# Patient Record
Sex: Female | Born: 1970 | Race: White | Hispanic: No | Marital: Single | Smoking: Former smoker
Health system: Southern US, Community
[De-identification: ages and names within clinical notes are randomized; demographics above are authoritative.]

## PROBLEM LIST (undated history)

## (undated) DIAGNOSIS — F319 Bipolar disorder, unspecified: Secondary | ICD-10-CM

## (undated) DIAGNOSIS — F32A Depression, unspecified: Secondary | ICD-10-CM

## (undated) DIAGNOSIS — J449 Chronic obstructive pulmonary disease, unspecified: Secondary | ICD-10-CM

## (undated) DIAGNOSIS — F609 Personality disorder, unspecified: Secondary | ICD-10-CM

## (undated) DIAGNOSIS — Z9889 Other specified postprocedural states: Secondary | ICD-10-CM

## (undated) DIAGNOSIS — M810 Age-related osteoporosis without current pathological fracture: Secondary | ICD-10-CM

## (undated) DIAGNOSIS — T7840XA Allergy, unspecified, initial encounter: Secondary | ICD-10-CM

## (undated) DIAGNOSIS — F909 Attention-deficit hyperactivity disorder, unspecified type: Secondary | ICD-10-CM

## (undated) DIAGNOSIS — N159 Renal tubulo-interstitial disease, unspecified: Secondary | ICD-10-CM

## (undated) DIAGNOSIS — R011 Cardiac murmur, unspecified: Secondary | ICD-10-CM

## (undated) DIAGNOSIS — S83249A Other tear of medial meniscus, current injury, unspecified knee, initial encounter: Secondary | ICD-10-CM

## (undated) DIAGNOSIS — F329 Major depressive disorder, single episode, unspecified: Secondary | ICD-10-CM

## (undated) DIAGNOSIS — R252 Cramp and spasm: Secondary | ICD-10-CM

## (undated) DIAGNOSIS — S83519A Sprain of anterior cruciate ligament of unspecified knee, initial encounter: Secondary | ICD-10-CM

## (undated) DIAGNOSIS — F4310 Post-traumatic stress disorder, unspecified: Secondary | ICD-10-CM

## (undated) DIAGNOSIS — J45909 Unspecified asthma, uncomplicated: Secondary | ICD-10-CM

## (undated) DIAGNOSIS — M1990 Unspecified osteoarthritis, unspecified site: Secondary | ICD-10-CM

## (undated) DIAGNOSIS — R112 Nausea with vomiting, unspecified: Secondary | ICD-10-CM

## (undated) DIAGNOSIS — F209 Schizophrenia, unspecified: Secondary | ICD-10-CM

## (undated) DIAGNOSIS — F419 Anxiety disorder, unspecified: Secondary | ICD-10-CM

## (undated) HISTORY — DX: Allergy, unspecified, initial encounter: T78.40XA

## (undated) HISTORY — DX: Sprain of anterior cruciate ligament of unspecified knee, initial encounter: S83.519A

## (undated) HISTORY — DX: Cardiac murmur, unspecified: R01.1

## (undated) HISTORY — DX: Age-related osteoporosis without current pathological fracture: M81.0

## (undated) HISTORY — DX: Unspecified osteoarthritis, unspecified site: M19.90

## (undated) HISTORY — DX: Chronic obstructive pulmonary disease, unspecified: J44.9

## (undated) HISTORY — DX: Attention-deficit hyperactivity disorder, unspecified type: F90.9

## (undated) HISTORY — DX: Schizophrenia, unspecified: F20.9

## (undated) HISTORY — PX: TUBAL LIGATION: SHX77

## (undated) HISTORY — DX: Personality disorder, unspecified: F60.9

## (undated) HISTORY — DX: Other tear of medial meniscus, current injury, unspecified knee, initial encounter: S83.249A

---

## 2007-04-24 ENCOUNTER — Ambulatory Visit: Payer: Self-pay

## 2008-02-11 ENCOUNTER — Emergency Department: Payer: Self-pay

## 2010-12-06 ENCOUNTER — Emergency Department: Payer: Self-pay | Admitting: Emergency Medicine

## 2011-06-09 ENCOUNTER — Emergency Department: Payer: Self-pay | Admitting: Emergency Medicine

## 2012-02-29 ENCOUNTER — Emergency Department (HOSPITAL_COMMUNITY)
Admission: EM | Admit: 2012-02-29 | Discharge: 2012-02-29 | Disposition: A | Discharge status: Home / Self Care | Payer: Self-pay | Attending: Emergency Medicine | Admitting: Emergency Medicine

## 2012-02-29 ENCOUNTER — Encounter (HOSPITAL_COMMUNITY): Payer: Self-pay | Admitting: *Deleted

## 2012-02-29 ENCOUNTER — Emergency Department (HOSPITAL_COMMUNITY): Payer: Self-pay

## 2012-02-29 DIAGNOSIS — M79642 Pain in left hand: Secondary | ICD-10-CM

## 2012-02-29 DIAGNOSIS — M7989 Other specified soft tissue disorders: Secondary | ICD-10-CM | POA: Insufficient documentation

## 2012-02-29 DIAGNOSIS — M79609 Pain in unspecified limb: Secondary | ICD-10-CM | POA: Insufficient documentation

## 2012-02-29 HISTORY — DX: Renal tubulo-interstitial disease, unspecified: N15.9

## 2012-02-29 MED ORDER — IBUPROFEN 800 MG PO TABS
800.0000 mg | ORAL_TABLET | Freq: Once | ORAL | Status: AC
  Administered 2012-02-29: 800 mg via ORAL
  Filled 2012-02-29: qty 1

## 2012-02-29 MED ORDER — IBUPROFEN 800 MG PO TABS: 800.0000 mg | ORAL_TABLET | Freq: Three times a day (TID) | ORAL | Status: AC

## 2012-02-29 NOTE — ED Notes (Signed)
Pt. Friend noted yelling out for the nurse.  When entering the room pt. And friend began to laugh and smirk. Pt. said that she was "fine and did not need anything."

## 2012-02-29 NOTE — ED Notes (Addendum)
C/o L hand swelling and pain, onset yesterday afternoon "while torquing a wrench", may have gripped it too tight. Swelling noted. CMS intact. Cap refill <2sec.

## 2012-02-29 NOTE — ED Provider Notes (Signed)
History     CSN: [id number]  Arrival date & time 02/29/12  0230   First MD Initiated Contact with Patient 02/29/12 [phone]      Chief Complaint  Patient presents with  . Hand Pain    (Consider location/radiation/quality/duration/timing/severity/associated sxs/prior treatment) HPI HX per PT. 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 No injury otherwise, no rash, no arm pain otherwise, no CP o SOB, no h/o same. Mild to mod in severity. Pain is dull and not radiating Past Medical History  Diagnosis Date  . Kidney infection     Past Surgical History  Procedure Date  . Cesarean section     No family history on file.  History  Substance Use Topics  . Smoking status: Never Smoker   . Smokeless tobacco: Not on file  . Alcohol Use: No    OB History    Grav Para Term Preterm Abortions TAB SAB Ect Mult Living                  Review of Systems  Constitutional: Negative for fever and chills.  HENT: Negative for neck pain and neck stiffness.   Eyes: Negative for pain.  Respiratory: Negative for shortness of breath.   Cardiovascular: Negative for chest pain.  Gastrointestinal: Negative for abdominal pain.  Genitourinary: Negative for dysuria.  Musculoskeletal: Negative for back pain.  Skin: Negative for rash.  Neurological: Negative for headaches.  All other systems reviewed and are negative.    Allergies  Keflex  Home Medications  No current outpatient prescriptions on file.  BP 120/80  Pulse 84  Temp 98.1 F (36.7 C) (Oral)  Resp 18  SpO2 99%  LMP 02/13/2012  Physical Exam  Constitutional: She is oriented to person, place, and time. She appears well-developed and well-nourished.  HENT:  Head: Normocephalic and atraumatic.  Eyes: Conjunctivae and EOM are normal. Pupils are equal, round, and reactive to light.  Neck: Trachea normal. Neck supple. No thyromegaly present.  Cardiovascular: Normal  rate, regular rhythm, S1 normal, S2 normal and normal pulses.     No systolic murmur is present   No diastolic murmur is present  Pulses:      Radial pulses are 2+ on the right side, and 2+ on the left side.  Pulmonary/Chest: Effort normal and breath sounds normal. She has no wheezes. She has no rhonchi. She has no rales. She exhibits no tenderness.  Abdominal: Soft. Normal appearance and bowel sounds are normal. There is no tenderness. There is no CVA tenderness and negative Murphy's sign.  Musculoskeletal:       LUE:s TTP with mild edema over dorsum of hand, no cords or erythema, no ecchymosis. No inc warmth to touch. Distal N/V intact. nontender over wrist and FA  Neurological: She is alert and oriented to person, place, and time. She has normal strength. No cranial nerve deficit or sensory deficit. GCS eye subscore is 4. GCS verbal subscore is 5. GCS motor subscore is 6.  Skin: Skin is warm and dry. No rash noted. She is not diaphoretic.  Psychiatric: Her speech is normal.       Cooperative and appropriate    ED Course  Procedures (including critical care time)  Labs Reviewed - No data to display Dg Hand Complete Left  02/29/2012  *RADIOLOGY REPORT*  Clinical Data: Hurt hand while tightening a wrench, pain across the metacarpals  LEFT HAND - COMPLETE 3+  VIEW  Comparison: None  Findings: Dorsal soft tissue swelling left hand. Bone mineralization normal. Joint spaces preserved. No fracture, dislocation, or bone destruction.  IMPRESSION: No acute osseous abnormalities.   Original Report Authenticated By: Lollie Marrow, M.D.    Hand pain/ swelling while using a wrench at work P[employer].   Ice, motrin and splint. After splint placed, distal n/v intact.  Plan d/c home with follow up occ health. RX motrin and precautions provided.    MDM   VS and nursing notes reviewed. Xray. PO medications and splint.         Sunnie Nielsen, MD 02/29/12 [phone]

## 2012-05-29 ENCOUNTER — Emergency Department (HOSPITAL_COMMUNITY): Payer: Self-pay

## 2012-05-29 ENCOUNTER — Encounter (HOSPITAL_COMMUNITY): Payer: Self-pay | Admitting: *Deleted

## 2012-05-29 ENCOUNTER — Emergency Department (HOSPITAL_COMMUNITY)
Admission: EM | Admit: 2012-05-29 | Discharge: 2012-05-29 | Disposition: A | Discharge status: Home / Self Care | Payer: Self-pay | Attending: Emergency Medicine | Admitting: Emergency Medicine

## 2012-05-29 DIAGNOSIS — Z79899 Other long term (current) drug therapy: Secondary | ICD-10-CM | POA: Insufficient documentation

## 2012-05-29 DIAGNOSIS — R0789 Other chest pain: Secondary | ICD-10-CM | POA: Insufficient documentation

## 2012-05-29 DIAGNOSIS — Z87448 Personal history of other diseases of urinary system: Secondary | ICD-10-CM | POA: Insufficient documentation

## 2012-05-29 DIAGNOSIS — R0781 Pleurodynia: Secondary | ICD-10-CM

## 2012-05-29 MED ORDER — HYDROCODONE-ACETAMINOPHEN 5-325 MG PO TABS
2.0000 | ORAL_TABLET | Freq: Once | ORAL | Status: AC
  Administered 2012-05-29: 2 via ORAL
  Filled 2012-05-29: qty 2

## 2012-05-29 MED ORDER — HYDROCODONE-ACETAMINOPHEN 5-325 MG PO TABS: 1.0000 | ORAL_TABLET | ORAL | Status: DC | PRN

## 2012-05-29 MED ORDER — CYCLOBENZAPRINE HCL 10 MG PO TABS: 10.0000 mg | ORAL_TABLET | Freq: Two times a day (BID) | ORAL | Status: DC | PRN
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
## 2012-05-30 NOTE — ED Provider Notes (Signed)
Medical screening examination/treatment/procedure(s) were performed by non-physician practitioner and as supervising physician I was immediately available for consultation/collaboration.  Geoffery Lyons, MD 05/30/12 [phone]

## 2012-06-07 ENCOUNTER — Emergency Department: Payer: Self-pay | Admitting: Emergency Medicine

## 2012-07-04 ENCOUNTER — Encounter (HOSPITAL_COMMUNITY): Payer: Self-pay | Admitting: Cardiology

## 2012-07-04 ENCOUNTER — Emergency Department (HOSPITAL_COMMUNITY)
Admission: EM | Admit: 2012-07-04 | Discharge: 2012-07-04 | Disposition: A | Discharge status: Home / Self Care | Payer: Self-pay | Attending: Emergency Medicine | Admitting: Emergency Medicine

## 2012-07-04 DIAGNOSIS — Z87448 Personal history of other diseases of urinary system: Secondary | ICD-10-CM | POA: Insufficient documentation

## 2012-07-04 DIAGNOSIS — Z760 Encounter for issue of repeat prescription: Secondary | ICD-10-CM | POA: Insufficient documentation

## 2012-07-04 MED ORDER — BUSPIRONE HCL 15 MG PO TABS: 15.0000 mg | ORAL_TABLET | Freq: Three times a day (TID) | ORAL | Status: DC

## 2012-07-04 MED ORDER — VENLAFAXINE HCL ER 75 MG PO CP24: 150.0000 mg | ORAL_CAPSULE | Freq: Every day | ORAL | Status: DC

## 2012-07-04 NOTE — ED Notes (Signed)
Pt requesting refills for Buspar 15mg  and Effexor XR 75mg . Pt has hoarse voice. A&Ox4, ambulatory, nad.

## 2012-07-04 NOTE — ED Provider Notes (Signed)
Medical screening examination/treatment/procedure(s) were performed by non-physician practitioner and as supervising physician I was immediately available for consultation/collaboration.   Richardean Canal, MD 07/04/12 [phone]

## 2012-07-04 NOTE — ED Notes (Signed)
Pt reports she is out of her medication and needs a refill. Does not have a PCP.

## 2012-07-04 NOTE — ED Provider Notes (Signed)
History     CSN: [id number]  Arrival date & time 07/04/12  1323   First MD Initiated Contact with Patient 07/04/12 1329      Chief Complaint  Patient presents with  . Medication Refill    (Consider location/radiation/quality/duration/timing/severity/associated sxs/prior treatment) HPI Comments: Patient is a 42 year old female who presents for a medication refill of Buspar and Effexor. Patient reports running out of her medications 3 days ago. Her doctor that normally fills her prescriptions works at [employer] and is not there today so she was unable to see her today. Patient denies any symptoms or pain.    Past Medical History  Diagnosis Date  . Kidney infection     Past Surgical History  Procedure Date  . Cesarean section     History reviewed. No pertinent family history.  History  Substance Use Topics  . Smoking status: Never Smoker   . Smokeless tobacco: Not on file  . Alcohol Use: No    OB History    Grav Para Term Preterm Abortions TAB SAB Ect Mult Living                  Review of Systems  Constitutional:       Medication refill  All other systems reviewed and are negative.    Allergies  Keflex  Home Medications   Current Outpatient Rx  Name  Route  Sig  Dispense  Refill  . BUSPIRONE HCL 15 MG PO TABS   Oral   Take 15 mg by mouth 3 (three) times daily.         . TRAZODONE HCL 100 MG PO TABS   Oral   Take 100 mg by mouth at bedtime.         . VENLAFAXINE HCL ER 75 MG PO CP24   Oral   Take 150 mg by mouth daily.           BP 140/80  Pulse 98  Temp 98.7 F (37.1 C) (Oral)  Resp 18  SpO2 99%  Physical Exam  Nursing note and vitals reviewed. Constitutional: She is oriented to person, place, and time. She appears well-developed and well-nourished. No distress.  HENT:  Head: Normocephalic and atraumatic.  Eyes: Conjunctivae normal are normal.  Neck: Normal range of motion. Neck supple.  Cardiovascular: Normal rate and regular  rhythm.  Exam reveals no gallop and no friction rub.   No murmur heard. Pulmonary/Chest: Effort normal and breath sounds normal. She has no wheezes. She has no rales. She exhibits no tenderness.  Abdominal: Soft. There is no tenderness.  Musculoskeletal: Normal range of motion.  Neurological: She is alert and oriented to person, place, and time.       Speech is goal-oriented. Moves limbs without ataxia.   Skin: Skin is warm and dry.  Psychiatric: She has a normal mood and affect. Her behavior is normal.    ED Course  Procedures (including critical care time)  Labs Reviewed - No data to display No results found.   1. Medication refill       MDM  1:44 PM Patient will have a short dose refill for buspar and effexor. Patient instructed to make an appointment with the doctor that normally prescribes her medications for further refills. No further evaluation needed at this times.         Emilia Beck, PA-C 07/04/12 1351

## 2012-09-22 ENCOUNTER — Emergency Department (HOSPITAL_COMMUNITY): Payer: Self-pay

## 2012-09-22 ENCOUNTER — Emergency Department (HOSPITAL_COMMUNITY)
Admission: EM | Admit: 2012-09-22 | Discharge: 2012-09-22 | Disposition: A | Discharge status: Home / Self Care | Payer: No Typology Code available for payment source | Attending: Emergency Medicine | Admitting: Emergency Medicine

## 2012-09-22 ENCOUNTER — Encounter (HOSPITAL_COMMUNITY): Payer: Self-pay | Admitting: *Deleted

## 2012-09-22 DIAGNOSIS — Z87448 Personal history of other diseases of urinary system: Secondary | ICD-10-CM | POA: Insufficient documentation

## 2012-09-22 DIAGNOSIS — Z3202 Encounter for pregnancy test, result negative: Secondary | ICD-10-CM | POA: Insufficient documentation

## 2012-09-22 DIAGNOSIS — S0990XA Unspecified injury of head, initial encounter: Secondary | ICD-10-CM | POA: Insufficient documentation

## 2012-09-22 DIAGNOSIS — Y9389 Activity, other specified: Secondary | ICD-10-CM | POA: Insufficient documentation

## 2012-09-22 DIAGNOSIS — Z79899 Other long term (current) drug therapy: Secondary | ICD-10-CM | POA: Insufficient documentation

## 2012-09-22 DIAGNOSIS — S20212A Contusion of left front wall of thorax, initial encounter: Secondary | ICD-10-CM

## 2012-09-22 DIAGNOSIS — S301XXA Contusion of abdominal wall, initial encounter: Secondary | ICD-10-CM | POA: Insufficient documentation

## 2012-09-22 DIAGNOSIS — IMO0002 Reserved for concepts with insufficient information to code with codable children: Secondary | ICD-10-CM | POA: Insufficient documentation

## 2012-09-22 DIAGNOSIS — S20219A Contusion of unspecified front wall of thorax, initial encounter: Secondary | ICD-10-CM | POA: Insufficient documentation

## 2012-09-22 DIAGNOSIS — Y92410 Unspecified street and highway as the place of occurrence of the external cause: Secondary | ICD-10-CM | POA: Insufficient documentation

## 2012-09-22 HISTORY — DX: Major depressive disorder, single episode, unspecified: F32.9

## 2012-09-22 HISTORY — DX: Depression, unspecified: F32.A

## 2012-09-22 MED ORDER — IBUPROFEN 800 MG PO TABS: 800.0000 mg | ORAL_TABLET | Freq: Three times a day (TID) | ORAL | Status: DC | PRN

## 2012-09-22 MED ORDER — IOHEXOL 300 MG/ML  SOLN
100.0000 mL | Freq: Once | INTRAMUSCULAR | Status: AC | PRN
  Administered 2012-09-22: 100 mL via INTRAVENOUS

## 2012-09-22 MED ORDER — FENTANYL CITRATE 0.05 MG/ML IJ SOLN
50.0000 ug | Freq: Once | INTRAMUSCULAR | Status: AC
  Administered 2012-09-22: 50 ug via INTRAVENOUS
  Filled 2012-09-22: qty 2

## 2012-09-22 MED ORDER — ONDANSETRON HCL 4 MG/2ML IJ SOLN
4.0000 mg | Freq: Once | INTRAMUSCULAR | Status: AC
  Administered 2012-09-22: 4 mg via INTRAVENOUS
  Filled 2012-09-22: qty 2

## 2012-09-22 MED ORDER — HYDROCODONE-ACETAMINOPHEN 5-325 MG PO TABS: 1.0000 | ORAL_TABLET | Freq: Four times a day (QID) | ORAL | Status: DC | PRN

## 2012-09-22 MED ORDER — METHOCARBAMOL 500 MG PO TABS: 500.0000 mg | ORAL_TABLET | Freq: Two times a day (BID) | ORAL | Status: DC

## 2012-09-22 MED ORDER — HYDROCODONE-ACETAMINOPHEN 5-325 MG PO TABS
1.0000 | ORAL_TABLET | Freq: Once | ORAL | Status: AC
  Administered 2012-09-22: 1 via ORAL
  Filled 2012-09-22: qty 1
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
## 2012-09-22 NOTE — ED Notes (Signed)
Patient transported to CT 

## 2012-09-22 NOTE — ED Notes (Signed)
Returned from xray
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
## 2012-09-23 NOTE — ED Provider Notes (Signed)
Medical screening examination/treatment/procedure(s) were performed by non-physician practitioner and as supervising physician I was immediately available for consultation/collaboration.  Vega Withrow T Jheremy Boger, MD 09/23/12 2000 

## 2012-09-27 ENCOUNTER — Emergency Department (INDEPENDENT_AMBULATORY_CARE_PROVIDER_SITE_OTHER)
Admission: EM | Admit: 2012-09-27 | Discharge: 2012-09-27 | Disposition: A | Discharge status: Home / Self Care | Payer: Self-pay | Source: Home / Self Care

## 2012-09-27 ENCOUNTER — Encounter (HOSPITAL_COMMUNITY): Payer: Self-pay | Admitting: Emergency Medicine

## 2012-09-27 DIAGNOSIS — R071 Chest pain on breathing: Secondary | ICD-10-CM

## 2012-09-27 DIAGNOSIS — R0789 Other chest pain: Secondary | ICD-10-CM

## 2012-09-27 MED ORDER — MELOXICAM 15 MG PO TABS: 15.0000 mg | ORAL_TABLET | Freq: Every day | ORAL | Status: DC

## 2012-09-27 MED ORDER — HYDROCODONE-ACETAMINOPHEN 5-325 MG PO TABS: 1.0000 | ORAL_TABLET | ORAL | Status: DC | PRN

## 2012-09-27 NOTE — ED Provider Notes (Signed)
Medical screening examination/treatment/procedure(s) were performed by resident physician or non-physician practitioner and as supervising physician I was immediately available for consultation/collaboration.   Barkley Bruns MD.   Linna Hoff, MD 09/27/12 2000
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
## 2012-10-20 ENCOUNTER — Emergency Department (HOSPITAL_COMMUNITY): Payer: Self-pay

## 2012-10-20 ENCOUNTER — Encounter (HOSPITAL_COMMUNITY): Payer: Self-pay | Admitting: *Deleted

## 2012-10-20 ENCOUNTER — Emergency Department (HOSPITAL_COMMUNITY)
Admission: EM | Admit: 2012-10-20 | Discharge: 2012-10-20 | Discharge status: Against Medical Advice | Payer: Self-pay | Attending: Emergency Medicine | Admitting: Emergency Medicine

## 2012-10-20 DIAGNOSIS — Y929 Unspecified place or not applicable: Secondary | ICD-10-CM | POA: Insufficient documentation

## 2012-10-20 DIAGNOSIS — S3981XA Other specified injuries of abdomen, initial encounter: Secondary | ICD-10-CM | POA: Insufficient documentation

## 2012-10-20 DIAGNOSIS — Z79899 Other long term (current) drug therapy: Secondary | ICD-10-CM | POA: Insufficient documentation

## 2012-10-20 DIAGNOSIS — W19XXXA Unspecified fall, initial encounter: Secondary | ICD-10-CM

## 2012-10-20 DIAGNOSIS — F3289 Other specified depressive episodes: Secondary | ICD-10-CM | POA: Insufficient documentation

## 2012-10-20 DIAGNOSIS — Y93E1 Activity, personal bathing and showering: Secondary | ICD-10-CM | POA: Insufficient documentation

## 2012-10-20 DIAGNOSIS — F329 Major depressive disorder, single episode, unspecified: Secondary | ICD-10-CM | POA: Insufficient documentation

## 2012-10-20 DIAGNOSIS — Z87448 Personal history of other diseases of urinary system: Secondary | ICD-10-CM | POA: Insufficient documentation

## 2012-10-20 DIAGNOSIS — R296 Repeated falls: Secondary | ICD-10-CM | POA: Insufficient documentation

## 2012-10-20 DIAGNOSIS — S298XXA Other specified injuries of thorax, initial encounter: Secondary | ICD-10-CM | POA: Insufficient documentation

## 2012-10-20 DIAGNOSIS — Z3202 Encounter for pregnancy test, result negative: Secondary | ICD-10-CM | POA: Insufficient documentation

## 2012-10-20 DIAGNOSIS — R0602 Shortness of breath: Secondary | ICD-10-CM | POA: Insufficient documentation

## 2012-10-20 DIAGNOSIS — G8929 Other chronic pain: Secondary | ICD-10-CM

## 2012-10-20 LAB — POCT I-STAT, CHEM 8
Creatinine, Ser: 0.6 mg/dL (ref 0.50–1.10)
Glucose, Bld: 91 mg/dL (ref 70–99)
HCT: 38 % (ref 36.0–46.0)
Hemoglobin: 12.9 g/dL (ref 12.0–15.0)
Potassium: 3 mEq/L — ABNORMAL LOW (ref 3.5–5.1)
TCO2: 24 mmol/L (ref 0–100)

## 2012-10-20 LAB — POCT PREGNANCY, URINE: Preg Test, Ur: NEGATIVE

## 2012-10-20 LAB — CBC
HCT: 36.4 % (ref 36.0–46.0)
Hemoglobin: 12.9 g/dL (ref 12.0–15.0)
MCH: 28 pg (ref 26.0–34.0)
MCHC: 35.4 g/dL (ref 30.0–36.0)
RDW: 13.6 % (ref 11.5–15.5)

## 2012-10-20 MED ORDER — IBUPROFEN 800 MG PO TABS: 800.0000 mg | ORAL_TABLET | Freq: Once | ORAL | Status: DC

## 2012-10-20 MED ORDER — POTASSIUM CHLORIDE CRYS ER 20 MEQ PO TBCR: 40.0000 meq | EXTENDED_RELEASE_TABLET | Freq: Once | ORAL | Status: DC
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
## 2012-10-20 NOTE — ED Provider Notes (Signed)
History     CSN: [id number]  Arrival date & time 10/20/12  4098   First MD Initiated Contact with Patient 10/20/12 [phone]      Chief Complaint  Patient presents with  . Fall  . Shortness of Breath  . Chest Pain    HPI Sandra Jensen is a 42 y.o. female who fell in the shower 2 hours ago, she fell on her chest, she says hurts on inspiration hurts when she breathes, her chin hurts some and she has epigastric pain. The pain is severe, nonradiating, she has not taken anything for it. No history of cardiac disease. No history of venous thromboembolic disease.   Past Medical History  Diagnosis Date  . Kidney infection   . Depression     Past Surgical History  Procedure Laterality Date  . Cesarean section      No family history on file.  History  Substance Use Topics  . Smoking status: Never Smoker   . Smokeless tobacco: Not on file  . Alcohol Use: No    OB History   Grav Para Term Preterm Abortions TAB SAB Ect Mult Living                  Review of Systems At least 10pt or greater review of systems completed and are negative except where specified in the HPI.  Allergies  Keflex  Home Medications   Current Outpatient Rx  Name  Route  Sig  Dispense  Refill  . busPIRone (BUSPAR) 15 MG tablet   Oral   Take 1 tablet (15 mg total) by mouth 3 (three) times daily.   10 tablet   0   . traZODone (DESYREL) 100 MG tablet   Oral   Take 100 mg by mouth at bedtime.         Marland Kitchen venlafaxine XR (EFFEXOR-XR) 75 MG 24 hr capsule   Oral   Take 225 mg by mouth daily.           BP 127/78  Pulse 95  Temp(Src) 97.9 F (36.6 C) (Oral)  Resp 20  SpO2 97%  LMP 09/26/2012  Physical Exam  Nursing notes reviewed.  Electronic medical record reviewed. VITAL SIGNS:   Filed Vitals:   10/20/12 0403  BP: 127/78  Pulse: 95  Temp: 97.9 F (36.6 C)  TempSrc: Oral  Resp: 20  SpO2: 97%   CONSTITUTIONAL: Awake, oriented, appears non-toxic HENT: Atraumatic, normocephalic,  oral mucosa pink and moist, airway patent. Nares patent without drainage. External ears normal. EYES: Conjunctiva clear, EOMI, PERRLA NECK: Trachea midline, non-tender, supple CARDIOVASCULAR: Normal heart rate, Normal rhythm, No murmurs, rubs, gallops PULMONARY/CHEST: Clear to auscultation, no rhonchi, wheezes, or rales. Symmetrical breath sounds. Non-tender. ABDOMINAL: Non-distended, soft, non-tender - no rebound or guarding.  BS normal. NEUROLOGIC: Non-focal, moving all four extremities, no gross sensory or motor deficits. EXTREMITIES: No clubbing, cyanosis, or edema SKIN: Warm, Dry, No erythema, No rash  ED Course  Procedures (including critical care time)  Date: 10/20/2012  Rate: 85  Rhythm: normal sinus rhythm  QRS Axis: normal  Intervals: normal  ST/T Wave abnormalities: Diffuse flattening of T waves  Conduction Disutrbances: none  Narrative Interpretation: Diffuse flattening of T waves, nonspecific T-wave changes     Labs Reviewed  POCT I-STAT, CHEM 8 - Abnormal; Notable for the following:    Potassium 3.0 (*)    All other components within normal limits  CBC  POCT I-STAT TROPONIN I  POCT PREGNANCY, URINE  Dg Chest 2 View  10/20/2012  *RADIOLOGY REPORT*  Clinical Data: Shortness of breath and chest pain after fall.  CHEST - 2 VIEW  Comparison: 06/18/2012.  CT chest 09/22/2012  Findings: The heart size and pulmonary vascularity are normal. The lungs appear clear and expanded without focal air space disease or consolidation. No blunting of the costophrenic angles.  No pneumothorax.  Mediastinal contours appear intact.  No sternal depression.  No significant change since previous study.  IMPRESSION: No evidence of active pulmonary disease or acute post-traumatic change.   Original Report Authenticated By: Burman Nieves, M.D.    Ct Head Wo Contrast  10/20/2012  *RADIOLOGY REPORT*  Clinical Data: The patient fell in bathtub, striking the chin.  No loss of consciousness.  CT  HEAD WITHOUT CONTRAST  Technique:  Contiguous axial images were obtained from the base of the skull through the vertex without contrast.  Comparison: 09/22/2012  Findings: The ventricles and sulci are symmetrical without significant effacement, displacement, or dilatation. No mass effect or midline shift. No abnormal extra-axial fluid collections. The grey-white matter junction is distinct. Basal cisterns are not effaced. No acute intracranial hemorrhage. No depressed skull fractures.  Visualized paranasal sinuses and mastoid air cells are not opacified.  No significant change since previous study.  IMPRESSION: No acute intracranial abnormalities.   Original Report Authenticated By: Burman Nieves, M.D.      1. Chest wall pain, chronic   2. Fall, initial encounter       MDM  Sandra Jensen is a 42 y.o. female who "fell" in the shower it has come to the ER for evaluation. Patient's having "severe" pain.  Patient is in no acute distress, her hair is not wet she does not appear like she's been out of the shower - even 2 hours ago I would expect some degree of dampness in her hair. In addition I do not see any signs of trauma on this patient.  Sister is accompanying her and asking for Percocet. This point we'll treat with ibuprofen and obtain CT of the head.   Workup unremarkable.  Patient left AGAINST MEDICAL ADVICE, persistently was asking for Percocet, was offered ibuprofen initially, and then left before I could speak with them again.    Jones Skene, MD 10/22/12 [phone]

## 2012-10-20 NOTE — ED Notes (Signed)
Pt walked out angry cause she did not recive percocet.

## 2013-05-03 ENCOUNTER — Encounter (HOSPITAL_COMMUNITY): Payer: Self-pay | Admitting: Emergency Medicine

## 2013-05-03 ENCOUNTER — Emergency Department (HOSPITAL_COMMUNITY): Payer: Self-pay

## 2013-05-03 ENCOUNTER — Emergency Department (HOSPITAL_COMMUNITY)
Admission: EM | Admit: 2013-05-03 | Discharge: 2013-05-03 | Disposition: A | Discharge status: Home / Self Care | Payer: Self-pay | Attending: Emergency Medicine | Admitting: Emergency Medicine

## 2013-05-03 DIAGNOSIS — R1032 Left lower quadrant pain: Secondary | ICD-10-CM | POA: Insufficient documentation

## 2013-05-03 DIAGNOSIS — F3289 Other specified depressive episodes: Secondary | ICD-10-CM | POA: Insufficient documentation

## 2013-05-03 DIAGNOSIS — Z791 Long term (current) use of non-steroidal anti-inflammatories (NSAID): Secondary | ICD-10-CM | POA: Insufficient documentation

## 2013-05-03 DIAGNOSIS — Z87448 Personal history of other diseases of urinary system: Secondary | ICD-10-CM | POA: Insufficient documentation

## 2013-05-03 DIAGNOSIS — Z79899 Other long term (current) drug therapy: Secondary | ICD-10-CM | POA: Insufficient documentation

## 2013-05-03 DIAGNOSIS — F329 Major depressive disorder, single episode, unspecified: Secondary | ICD-10-CM | POA: Insufficient documentation

## 2013-05-03 DIAGNOSIS — Z3202 Encounter for pregnancy test, result negative: Secondary | ICD-10-CM | POA: Insufficient documentation

## 2013-05-03 DIAGNOSIS — R109 Unspecified abdominal pain: Secondary | ICD-10-CM

## 2013-05-03 LAB — DIFFERENTIAL
Basophils Absolute: 0 10*3/uL (ref 0.0–0.1)
Basophils Relative: 0 % (ref 0–1)
Eosinophils Absolute: 0.1 10*3/uL (ref 0.0–0.7)
Eosinophils Relative: 1 % (ref 0–5)
Lymphocytes Relative: 26 % (ref 12–46)
Lymphs Abs: 1.8 K/uL (ref 0.7–4.0)
Monocytes Absolute: 0.3 10*3/uL (ref 0.1–1.0)
Monocytes Relative: 5 % (ref 3–12)
Neutro Abs: 4.8 10*3/uL (ref 1.7–7.7)
Neutrophils Relative %: 68 % (ref 43–77)

## 2013-05-03 LAB — CBC
HCT: 36.3 % (ref 36.0–46.0)
Hemoglobin: 12.4 g/dL (ref 12.0–15.0)
MCH: 29.3 pg (ref 26.0–34.0)
MCHC: 34.2 g/dL (ref 30.0–36.0)
MCV: 85.8 fL (ref 78.0–100.0)
Platelets: 201 K/uL (ref 150–400)
RBC: 4.23 MIL/uL (ref 3.87–5.11)
RDW: 13.4 % (ref 11.5–15.5)
WBC: 7.1 K/uL (ref 4.0–10.5)

## 2013-05-03 LAB — BASIC METABOLIC PANEL
BUN: 8 mg/dL (ref 6–23)
CO2: 25 mEq/L (ref 19–32)
Calcium: 9.3 mg/dL (ref 8.4–10.5)
Chloride: 104 mEq/L (ref 96–112)
Creatinine, Ser: 0.51 mg/dL (ref 0.50–1.10)
Glucose, Bld: 93 mg/dL (ref 70–99)

## 2013-05-03 LAB — URINALYSIS, ROUTINE W REFLEX MICROSCOPIC
Glucose, UA: NEGATIVE mg/dL
Hgb urine dipstick: NEGATIVE
Ketones, ur: NEGATIVE mg/dL
Nitrite: NEGATIVE
Protein, ur: NEGATIVE mg/dL
Urobilinogen, UA: 0.2 mg/dL (ref 0.0–1.0)

## 2013-05-03 LAB — PREGNANCY, URINE: Preg Test, Ur: NEGATIVE

## 2013-05-03 MED ORDER — SODIUM CHLORIDE 0.9 % IV BOLUS (SEPSIS)
1000.0000 mL | Freq: Once | INTRAVENOUS | Status: AC
  Administered 2013-05-03: 1000 mL via INTRAVENOUS

## 2013-05-03 MED ORDER — ONDANSETRON HCL 4 MG/2ML IJ SOLN
4.0000 mg | Freq: Once | INTRAMUSCULAR | Status: AC
  Administered 2013-05-03: 4 mg via INTRAVENOUS
  Filled 2013-05-03: qty 2

## 2013-05-03 MED ORDER — MORPHINE SULFATE 4 MG/ML IJ SOLN
4.0000 mg | Freq: Once | INTRAMUSCULAR | Status: AC | PRN
  Administered 2013-05-03: 4 mg via INTRAVENOUS
  Filled 2013-05-03: qty 1

## 2013-05-03 MED ORDER — IOHEXOL 300 MG/ML  SOLN
100.0000 mL | Freq: Once | INTRAMUSCULAR | Status: AC | PRN
  Administered 2013-05-03: 100 mL via INTRAVENOUS

## 2013-05-03 MED ORDER — IOHEXOL 300 MG/ML  SOLN
50.0000 mL | Freq: Once | INTRAMUSCULAR | Status: AC | PRN
  Administered 2013-05-03: 50 mL via ORAL

## 2013-05-03 MED ORDER — OXYCODONE-ACETAMINOPHEN 5-325 MG PO TABS: 1.0000 | ORAL_TABLET | Freq: Four times a day (QID) | ORAL | Status: DC | PRN

## 2013-05-03 MED ORDER — PROMETHAZINE HCL 25 MG PO TABS: 25.0000 mg | ORAL_TABLET | Freq: Four times a day (QID) | ORAL | Status: DC | PRN

## 2013-05-03 MED ORDER — MORPHINE SULFATE 4 MG/ML IJ SOLN
4.0000 mg | Freq: Once | INTRAMUSCULAR | Status: AC
  Administered 2013-05-03: 4 mg via INTRAVENOUS
  Filled 2013-05-03: qty 1

## 2013-05-03 NOTE — ED Provider Notes (Signed)
CSN: [id number]     Arrival date & time 05/03/13  1657 History   First MD Initiated Contact with Patient 05/03/13 1707     Chief Complaint  Patient presents with  . Abdominal Pain   (Consider location/radiation/quality/duration/timing/severity/associated sxs/prior Treatment) HPI  Sandra Jensen is a 42 y.o.female without any significant PMH presents to the ER with complaints of LLQ pain that started this morning. She describes it as sharp and constant. She has a PMH of of kidney infection but this was back in 1998, otherwise she is typically healthy. She feels as though her abdomen is swollen and tender to the touch. She denies having fevers, nausea, vomiting, diarrhea, dysuria, hematuria, diarrhea, vaginal bleeding, vaginal discharge.    Past Medical History  Diagnosis Date  . Kidney infection   . Depression    Past Surgical History  Procedure Laterality Date  . Cesarean section     History reviewed. No pertinent family history. History  Substance Use Topics  . Smoking status: Never Smoker   . Smokeless tobacco: Not on file  . Alcohol Use: No   OB History   Grav Para Term Preterm Abortions TAB SAB Ect Mult Living                 Review of Systems The patient denies anorexia, fever, weight loss,, vision loss, decreased hearing, hoarseness, chest pain, syncope, dyspnea on exertion, peripheral edema, balance deficits, hemoptysis, , melena, hematochezia, severe indigestion/heartburn, hematuria, incontinence, genital sores, muscle weakness, suspicious skin lesions, transient blindness, difficulty walking, depression, unusual weight change, abnormal bleeding, enlarged lymph nodes, angioedema, and breast masses.  Allergies  Keflex  Home Medications   Current Outpatient Rx  Name  Route  Sig  Dispense  Refill  . FLUoxetine (PROZAC) 40 MG capsule   Oral   Take 40 mg by mouth daily.         . naproxen sodium (ANAPROX) 220 MG tablet   Oral   Take 660 mg by mouth 2 (two) times  daily with a meal.         . QUEtiapine (SEROQUEL) 25 MG tablet   Oral   Take 25 mg by mouth 2 (two) times daily.         Marland Kitchen oxyCODONE-acetaminophen (PERCOCET/ROXICET) 5-325 MG per tablet   Oral   Take 1-2 tablets by mouth every 6 (six) hours as needed for severe pain.   12 tablet   0   . promethazine (PHENERGAN) 25 MG tablet   Oral   Take 1 tablet (25 mg total) by mouth every 6 (six) hours as needed for nausea or vomiting.   20 tablet   0    BP 124/85  Pulse 90  Temp(Src) 98 F (36.7 C) (Oral)  Resp 18  SpO2 96%  LMP 04/20/2013 Physical Exam  Nursing note and vitals reviewed. Constitutional: She appears well-developed and well-nourished. No distress.  HENT:  Head: Normocephalic and atraumatic.  Eyes: Pupils are equal, round, and reactive to light.  Neck: Normal range of motion. Neck supple.  Cardiovascular: Normal rate and regular rhythm.   Pulmonary/Chest: Effort normal.  Abdominal: Soft. Bowel sounds are normal. There is tenderness in the left lower quadrant. There is guarding. There is no rigidity, no rebound, no CVA tenderness, no tenderness at McBurney's point and negative Murphy's sign.  Neurological: She is alert.  Skin: Skin is warm and dry.    ED Course  Procedures (including critical care time) Labs Review Labs Reviewed  BASIC METABOLIC PANEL  URINALYSIS, ROUTINE W REFLEX MICROSCOPIC  PREGNANCY, URINE  CBC  DIFFERENTIAL  CBC WITH DIFFERENTIAL   Imaging Review Ct Abdomen Pelvis W Contrast  05/03/2013   CLINICAL DATA:  Left lower quadrant pain, nausea  EXAM: CT ABDOMEN AND PELVIS WITH CONTRAST  TECHNIQUE: Multidetector CT imaging of the abdomen and pelvis was performed using the standard protocol following bolus administration of intravenous contrast.  CONTRAST:  50mL OMNIPAQUE IOHEXOL 300 MG/ML SOLN, OMNIPAQUE IOHEXOL 300 MG/ML SOLN  COMPARISON:  09/22/2012  FINDINGS: Sagittal images of the spine are unremarkable.  Lung bases are unremarkable.  Low density lesion within right hepatic lobe laterally measures 8 mm stable in size in appearance from prior exam. A hemangioma inferior aspect of the right hepatic lobe is stable in size in appearance from prior exam. No intrahepatic biliary ductal dilatation. The pancreas, spleen and adrenals are unremarkable. No calcified gallstones are noted within gallbladder.  Kidneys are symmetrical in size and enhancement. No hydronephrosis or hydroureter.  Delayed renal images shows bilateral renal symmetrical excretion. Bilateral visualized proximal ureter is unremarkable.  No small bowel obstruction. No ascites or free air. No adenopathy. There is no pericecal inflammation. Normal appendix partially visualized in axial image 54.  There is no evidence of colitis or diverticulitis. The uterus and adnexa are unremarkable. Limited assessment of the left colon and sigmoid colon which is empty.  No adnexal masses noted. The urinary bladder is unremarkable. Small nonspecific bilateral inguinal lymph nodes. Again noted splenomegaly stress set again noted hepatomegaly with left hepatic lobe extending in left upper quadrant above the spleen.  IMPRESSION: 1. No acute inflammatory process within abdomen or pelvis.  2. No hydronephrosis or hydroureter. 3. Stable hemangioma inferior aspect of the liver. 4. No pericecal inflammation. Normal appendix. 5. No evidence of colitis or diverticulitis.   Electronically Signed   By: Natasha Mead M.D.   On: 05/03/2013 20:17    EKG Interpretation   None       MDM   1. Abdominal pain    Patients labs and CT scan are unremarkable. Her pain is much better and she has tolerated oral fluids in the ED. Will give her pain and nausea medication and have her follow-up with PCP. Her belly was re-examined just prior to discharge and she has no peritoneal signs.  42 y.o.Sandra Jensen's evaluation in the Emergency Department is complete. It has been determined that no acute conditions requiring  further emergency intervention are present at this time. The patient/guardian have been advised of the diagnosis and plan. We have discussed signs and symptoms that warrant return to the ED, such as changes or worsening in symptoms.  Vital signs are stable at discharge. Filed Vitals:   05/03/13 1718  BP: 124/85  Pulse: 90  Temp: 98 F (36.7 C)  Resp: 18    Patient/guardian has voiced understanding and agreed to follow-up with the PCP or specialist.     Dorthula Matas, PA-C 05/03/13 2055

## 2013-05-03 NOTE — ED Notes (Signed)
Pt reports onset sharp LLQ abdominal pain this morning, tenderness to palpation and slight swelling noted.

## 2013-05-03 NOTE — ED Provider Notes (Signed)
Medical screening examination/treatment/procedure(s) were performed by non-physician practitioner and as supervising physician I was immediately available for consultation/collaboration.  EKG Interpretation   None         Gavin Pound. Oletta Lamas, MD 05/03/13 2057

## 2013-06-20 ENCOUNTER — Emergency Department (HOSPITAL_COMMUNITY)
Admission: EM | Admit: 2013-06-20 | Discharge: 2013-06-20 | Disposition: A | Discharge status: Home / Self Care | Payer: Self-pay | Attending: Emergency Medicine | Admitting: Emergency Medicine

## 2013-06-20 ENCOUNTER — Encounter (HOSPITAL_COMMUNITY): Payer: Self-pay | Admitting: Emergency Medicine

## 2013-06-20 DIAGNOSIS — F332 Major depressive disorder, recurrent severe without psychotic features: Secondary | ICD-10-CM

## 2013-06-20 DIAGNOSIS — Z87440 Personal history of urinary (tract) infections: Secondary | ICD-10-CM | POA: Insufficient documentation

## 2013-06-20 DIAGNOSIS — R443 Hallucinations, unspecified: Secondary | ICD-10-CM | POA: Insufficient documentation

## 2013-06-20 DIAGNOSIS — F3130 Bipolar disorder, current episode depressed, mild or moderate severity, unspecified: Secondary | ICD-10-CM | POA: Insufficient documentation

## 2013-06-20 DIAGNOSIS — R450 Nervousness: Secondary | ICD-10-CM | POA: Insufficient documentation

## 2013-06-20 DIAGNOSIS — F4310 Post-traumatic stress disorder, unspecified: Secondary | ICD-10-CM

## 2013-06-20 DIAGNOSIS — Z3202 Encounter for pregnancy test, result negative: Secondary | ICD-10-CM | POA: Insufficient documentation

## 2013-06-20 DIAGNOSIS — F319 Bipolar disorder, unspecified: Secondary | ICD-10-CM

## 2013-06-20 DIAGNOSIS — H5316 Psychophysical visual disturbances: Secondary | ICD-10-CM | POA: Insufficient documentation

## 2013-06-20 DIAGNOSIS — F259 Schizoaffective disorder, unspecified: Secondary | ICD-10-CM

## 2013-06-20 DIAGNOSIS — Z79899 Other long term (current) drug therapy: Secondary | ICD-10-CM | POA: Insufficient documentation

## 2013-06-20 DIAGNOSIS — F411 Generalized anxiety disorder: Secondary | ICD-10-CM | POA: Insufficient documentation

## 2013-06-20 DIAGNOSIS — G479 Sleep disorder, unspecified: Secondary | ICD-10-CM | POA: Insufficient documentation

## 2013-06-20 HISTORY — DX: Post-traumatic stress disorder, unspecified: F43.10

## 2013-06-20 HISTORY — DX: Bipolar disorder, unspecified: F31.9

## 2013-06-20 HISTORY — DX: Anxiety disorder, unspecified: F41.9

## 2013-06-20 LAB — ETHANOL: Alcohol, Ethyl (B): <11 mg/dL (ref 0–11)

## 2013-06-20 LAB — RAPID URINE DRUG SCREEN, HOSP PERFORMED
Amphetamines: NOT DETECTED
Cocaine: NOT DETECTED
Opiates: NOT DETECTED
Tetrahydrocannabinol: NOT DETECTED

## 2013-06-20 LAB — COMPREHENSIVE METABOLIC PANEL
ALT: 12 U/L (ref 0–35)
AST: 13 U/L (ref 0–37)
BUN: 11 mg/dL (ref 6–23)
CO2: 24 mEq/L (ref 19–32)
Calcium: 9 mg/dL (ref 8.4–10.5)
Creatinine, Ser: 0.53 mg/dL (ref 0.50–1.10)
GFR calc non Af Amer: >90 mL/min (ref >90)
Sodium: 137 mEq/L (ref 135–145)
Total Protein: 7.1 g/dL (ref 6.0–8.3)

## 2013-06-20 LAB — CBC
HCT: 38.8 % (ref 36.0–46.0)
MCH: 29.1 pg (ref 26.0–34.0)
MCHC: 34 g/dL (ref 30.0–36.0)
MCV: 85.7 fL (ref 78.0–100.0)
Platelets: 240 10*3/uL (ref 150–400)
RDW: 13.5 % (ref 11.5–15.5)
WBC: 4.4 10*3/uL (ref 4.0–10.5)

## 2013-06-20 LAB — SALICYLATE LEVEL: Salicylate Lvl: <2 mg/dL — ABNORMAL LOW (ref 2.8–20.0)

## 2013-06-20 MED ORDER — ALUM & MAG HYDROXIDE-SIMETH 200-200-20 MG/5ML PO SUSP: 30.0000 mL | ORAL | Status: DC | PRN

## 2013-06-20 MED ORDER — LORAZEPAM 1 MG PO TABS: 1.0000 mg | ORAL_TABLET | Freq: Once | ORAL | Status: DC

## 2013-06-20 MED ORDER — ZOLPIDEM TARTRATE 5 MG PO TABS: 5.0000 mg | ORAL_TABLET | Freq: Every evening | ORAL | Status: DC | PRN

## 2013-06-20 MED ORDER — NICOTINE 21 MG/24HR TD PT24: 21.0000 mg | MEDICATED_PATCH | Freq: Every day | TRANSDERMAL | Status: DC

## 2013-06-20 MED ORDER — LORAZEPAM 1 MG PO TABS: 1.0000 mg | ORAL_TABLET | Freq: Three times a day (TID) | ORAL | Status: DC | PRN

## 2013-06-20 MED ORDER — IBUPROFEN 200 MG PO TABS: 600.0000 mg | ORAL_TABLET | Freq: Three times a day (TID) | ORAL | Status: DC | PRN

## 2013-06-20 MED ORDER — ONDANSETRON HCL 4 MG PO TABS: 4.0000 mg | ORAL_TABLET | Freq: Three times a day (TID) | ORAL | Status: DC | PRN

## 2013-06-20 NOTE — ED Notes (Signed)
Pt given outpatient resources by assessment team. All belongings returned to patient at discharge.

## 2013-06-20 NOTE — ED Notes (Signed)
Pt sts that her medication is not helping. Pt sts she sees shadows and is scared of the dark. Pt denies any SI or HI at this time. Pt is cooperative in triage. At times pt thinks she his hearing voices.

## 2013-06-20 NOTE — ED Provider Notes (Signed)
CSN: [id number]     Arrival date & time 06/20/13  1204 History   First MD Initiated Contact with Patient 06/20/13 1235     Chief Complaint  Patient presents with  . Medical Clearance   (Consider location/radiation/quality/duration/timing/severity/associated sxs/prior Treatment) HPI Comments: Patient is a 42 year old female with a past medical history of depression, bipolar, PTSD, anxiety who presents to the emergency department with her sister complaining of visual and auditory hallucinations, worsening depression. Patient states she has been seeing shadows recently and hearing voices even though there is no talking to her. Also states she is very scared of the dark and will not leave the house once it becomes dark outside. She sees a therapist at [facility] and states all the medications that she is on do not help with her depression. States she cannot focus on her daily activities. Denies suicidal or homicidal ideations. She took her sister's Adderall. Denies any other drug use or alcohol use.  The history is provided by the patient and a relative.    Past Medical History  Diagnosis Date  . Kidney infection   . Depression   . Bipolar 1 disorder, depressed   . PTSD (post-traumatic stress disorder)   . Anxiety    Past Surgical History  Procedure Laterality Date  . Cesarean section     History reviewed. No pertinent family history. History  Substance Use Topics  . Smoking status: Never Smoker   . Smokeless tobacco: Never Used  . Alcohol Use: No   OB History   Grav Para Term Preterm Abortions TAB SAB Ect Mult Living                 Review of Systems  Psychiatric/Behavioral: Positive for hallucinations, sleep disturbance and dysphoric mood. The patient is nervous/anxious.   All other systems reviewed and are negative.    Allergies  Keflex  Home Medications   Current Outpatient Rx  Name  Route  Sig  Dispense  Refill  . FLUoxetine (PROZAC) 40 MG capsule   Oral   Take 40  mg by mouth daily.         Marland Kitchen ibuprofen (ADVIL,MOTRIN) 200 MG tablet   Oral   Take 400 mg by mouth every 6 (six) hours as needed.         . Lurasidone HCl (LATUDA) 20 MG TABS   Oral   Take 1 tablet by mouth daily.          BP 134/95  Pulse 100  Temp(Src) 98.3 F (36.8 C) (Oral)  Resp 16  SpO2 98% Physical Exam  Nursing note and vitals reviewed. Constitutional: She is oriented to person, place, and time. She appears well-developed and well-nourished. No distress.  HENT:  Head: Normocephalic and atraumatic.  Mouth/Throat: Oropharynx is clear and moist.  Eyes: Conjunctivae are normal.  Neck: Normal range of motion. Neck supple.  Cardiovascular: Normal rate, regular rhythm and normal heart sounds.   Pulmonary/Chest: Effort normal and breath sounds normal.  Abdominal: Soft. Bowel sounds are normal. There is no tenderness.  Musculoskeletal: Normal range of motion. She exhibits no edema.  Neurological: She is alert and oriented to person, place, and time.  Skin: Skin is warm and dry. She is not diaphoretic.  Psychiatric: Her speech is normal and behavior is normal. Her mood appears anxious. She exhibits a depressed mood. She expresses no homicidal and no suicidal ideation.  Flat affect.    ED Course  Procedures (including critical care time) Labs Review Labs Reviewed  SALICYLATE LEVEL - Abnormal; Notable for the following:    Salicylate Lvl <2.0 (*)    All other components within normal limits  ACETAMINOPHEN LEVEL  CBC  COMPREHENSIVE METABOLIC PANEL  ETHANOL  URINE RAPID DRUG SCREEN (HOSP PERFORMED)  POCT PREGNANCY, URINE  POCT PREGNANCY, URINE   Imaging Review No results found.  EKG Interpretation   None       MDM   1. Hallucinations     Patient presenting with auditory and visual hallucinations. No suicidal or homicidal ideations. Also with worsening depression. Labs pending. Psych hold. TTS consult. 2:46 PM Pt evaluated by psych, do not feel she  meets inpatient criteria. She is not a harm to herself or others. Concern for drug seeking behavior. Stable for discharge.    Trevor Mace, PA-C 06/20/13 1447

## 2013-06-20 NOTE — ED Notes (Signed)
Patient's sister in room demanding that patient be provided with a new psychiatrist as a PCP. Pt cursing stating "Vesta Mixer is just fucking bullshit!" Visitor asked to refrain from cursing. Visitor remains agitated.

## 2013-06-20 NOTE — ED Notes (Signed)
Pt verbally contracts for safety. 
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
## 2013-06-20 NOTE — Progress Notes (Signed)
Per discussion, with psych NP, patient psychiatrically stable for discharge. CSW met with pt and pt sister to discuss outpatient resources. Patient plans to follow up with [facility] in [place]. CSW discussed with EDP.   Marland KitchenCatha Gosselin, [place] [phone]  ED CSW .06/20/2013 1443pm

## 2013-06-20 NOTE — ED Notes (Signed)
Belongings sent home with sister

## 2013-06-20 NOTE — ED Notes (Signed)
Patient arrived to unit. No s/s of distress noted. Nurse Practitioner in room to assess patient. Pt cooperative with care at this time.

## 2013-06-20 NOTE — ED Notes (Signed)
Patient and belongings have been wanded by security 

## 2013-06-26 NOTE — ED Provider Notes (Signed)
Medical screening examination/treatment/procedure(s) were performed by non-physician practitioner and as supervising physician I was immediately available for consultation/collaboration.  EKG Interpretation   None         David H Yao, MD 06/26/13 1457 

## 2013-06-27 ENCOUNTER — Emergency Department: Payer: Self-pay | Admitting: Emergency Medicine

## 2013-07-08 ENCOUNTER — Emergency Department (HOSPITAL_COMMUNITY)
Admission: EM | Admit: 2013-07-08 | Discharge: 2013-07-09 | Disposition: A | Discharge status: Home / Self Care | Payer: Self-pay | Attending: Emergency Medicine | Admitting: Emergency Medicine

## 2013-07-08 DIAGNOSIS — F411 Generalized anxiety disorder: Secondary | ICD-10-CM | POA: Insufficient documentation

## 2013-07-08 DIAGNOSIS — Y9389 Activity, other specified: Secondary | ICD-10-CM | POA: Insufficient documentation

## 2013-07-08 DIAGNOSIS — Z87448 Personal history of other diseases of urinary system: Secondary | ICD-10-CM | POA: Insufficient documentation

## 2013-07-08 DIAGNOSIS — F3130 Bipolar disorder, current episode depressed, mild or moderate severity, unspecified: Secondary | ICD-10-CM | POA: Insufficient documentation

## 2013-07-08 DIAGNOSIS — IMO0002 Reserved for concepts with insufficient information to code with codable children: Secondary | ICD-10-CM | POA: Insufficient documentation

## 2013-07-08 DIAGNOSIS — W010XXA Fall on same level from slipping, tripping and stumbling without subsequent striking against object, initial encounter: Secondary | ICD-10-CM | POA: Insufficient documentation

## 2013-07-08 DIAGNOSIS — Y9289 Other specified places as the place of occurrence of the external cause: Secondary | ICD-10-CM | POA: Insufficient documentation

## 2013-07-08 DIAGNOSIS — M549 Dorsalgia, unspecified: Secondary | ICD-10-CM

## 2013-07-08 DIAGNOSIS — Z79899 Other long term (current) drug therapy: Secondary | ICD-10-CM | POA: Insufficient documentation

## 2013-07-09 ENCOUNTER — Encounter (HOSPITAL_COMMUNITY): Payer: Self-pay | Admitting: Emergency Medicine

## 2013-07-09 ENCOUNTER — Emergency Department (HOSPITAL_COMMUNITY): Payer: Self-pay

## 2013-07-09 MED ORDER — CYCLOBENZAPRINE HCL 10 MG PO TABS
10.0000 mg | ORAL_TABLET | Freq: Once | ORAL | Status: AC
  Administered 2013-07-09: 10 mg via ORAL
  Filled 2013-07-09: qty 1

## 2013-07-09 MED ORDER — IBUPROFEN 800 MG PO TABS
800.0000 mg | ORAL_TABLET | Freq: Once | ORAL | Status: AC
  Administered 2013-07-09: 800 mg via ORAL
  Filled 2013-07-09: qty 1

## 2013-07-09 MED ORDER — IBUPROFEN 800 MG PO TABS: 800.0000 mg | ORAL_TABLET | Freq: Three times a day (TID) | ORAL | Status: DC

## 2013-07-09 MED ORDER — CYCLOBENZAPRINE HCL 10 MG PO TABS: 10.0000 mg | ORAL_TABLET | Freq: Two times a day (BID) | ORAL | Status: DC | PRN

## 2013-07-09 NOTE — Discharge Instructions (Signed)
Back Injury Prevention °Back injuries can be extremely painful and difficult to heal. After having one back injury, you are much more likely to experience another later on. It is important to learn how to avoid injuring or re-injuring your back. The following tips can help you to prevent a back injury. °PHYSICAL FITNESS °· Exercise regularly and try to develop good tone in your abdominal muscles. Your abdominal muscles provide a lot of the support needed by your back. °· Do aerobic exercises (walking, jogging, biking, swimming) regularly. °· Do exercises that increase balance and strength (tai chi, yoga) regularly. This can decrease your risk of falling and injuring your back. °· Stretch before and after exercising. °· Maintain a healthy weight. The more you weigh, the more stress is placed on your back. For every pound of weight, 10 times that amount of pressure is placed on the back. °DIET °· Talk to your caregiver about how much calcium and vitamin D you need per day. These nutrients help to prevent weakening of the bones (osteoporosis). Osteoporosis can cause broken (fractured) bones that lead to back pain. °· Include good sources of calcium in your diet, such as dairy products, green, leafy vegetables, and products with calcium added (fortified). °· Include good sources of vitamin D in your diet, such as milk and foods that are fortified with vitamin D. °· Consider taking a nutritional supplement or a multivitamin if needed. °· Stop smoking if you smoke. °POSTURE °· Sit and stand up straight. Avoid leaning forward when you sit or hunching over when you stand. °· Choose chairs with good low back (lumbar) support. °· If you work at a desk, sit close to your work so you do not need to lean over. Keep your chin tucked in. Keep your neck drawn back and elbows bent at a right angle. Your arms should look like the letter "L." °· Sit high and close to the steering wheel when you drive. Add a lumbar support to your car  seat if needed. °· Avoid sitting or standing in one position for too long. Take breaks to get up, stretch, and walk around at least once every hour. Take breaks if you are driving for long periods of time. °· Sleep on your side with your knees slightly bent, or sleep on your back with a pillow under your knees. Do not sleep on your stomach. °LIFTING, TWISTING, AND REACHING °· Avoid heavy lifting, especially repetitive lifting. If you must do heavy lifting: °· Stretch before lifting. °· Work slowly. °· Rest between lifts. °· Use carts and dollies to move objects when possible. °· Make several small trips instead of carrying 1 heavy load. °· Ask for help when you need it. °· Ask for help when moving big, awkward objects. °· Follow these steps when lifting: °· Stand with your feet shoulder-width apart. °· Get as close to the object as you can. Do not try to pick up heavy objects that are far from your body. °· Use handles or lifting straps if they are available. °· Bend at your knees. Squat down, but keep your heels off the floor. °· Keep your shoulders pulled back, your chin tucked in, and your back straight. °· Lift the object slowly, tightening the muscles in your legs, abdomen, and buttocks. Keep the object as close to the center of your body as possible. °· When you put a load down, use these same guidelines in reverse. °· Do not: °· Lift the object above your waist. °·   Twist at the waist while lifting or carrying a load. Move your feet if you need to turn, not your waist. °· Bend over without bending at your knees. °· Avoid reaching over your head, across a table, or for an object on a high surface. °OTHER TIPS °· Avoid wet floors and keep sidewalks clear of ice to prevent falls. °· Do not sleep on a mattress that is too soft or too hard. °· Keep items that are used frequently within easy reach. °· Put heavier objects on shelves at waist level and lighter objects on lower or higher shelves. °· Find ways to  decrease your stress, such as exercise, massage, or relaxation techniques. Stress can build up in your muscles. Tense muscles are more vulnerable to injury. °· Seek treatment for depression or anxiety if needed. These conditions can increase your risk of developing back pain. °SEEK MEDICAL CARE IF: °· You injure your back. °· You have questions about diet, exercise, or other ways to prevent back injuries. °MAKE SURE YOU: °· Understand these instructions. °· Will watch your condition. °· Will get help right away if you are not doing well or get worse. °Document Released: 07/20/2004 Document Revised: 09/04/2011 Document Reviewed: 07/24/2011 °[employer]® Patient Information ©2014 [employer]. ° °

## 2013-07-09 NOTE — ED Notes (Signed)
MD at bedside. 

## 2013-07-09 NOTE — ED Notes (Signed)
Pt presents with mid back pain after a fall in the shower yesterday morning.
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
## 2013-07-09 NOTE — ED Notes (Signed)
Patient transported to X-ray 

## 2013-07-09 NOTE — ED Notes (Signed)
Returned from xray

## 2013-07-09 NOTE — ED Provider Notes (Signed)
CSN: [id number]     Arrival date & time 07/08/13  2321 History   First MD Initiated Contact with Patient 07/09/13 0243     Chief Complaint  Patient presents with  . Back Pain   (Consider location/radiation/quality/duration/timing/severity/associated sxs/prior Treatment) HPI History provided by patient. Fell in the shower yesterday morning around 10 AM injuring her lower back. She continues to have pain tonight. No radiation of sharp moderate pain. No weakness or numbness in lower extremities. No incontinence of bowel or bladder. No relief with over-the-counter medications. Patient denies any history of previous back problems.    Past Medical History  Diagnosis Date  . Kidney infection   . Depression   . Bipolar 1 disorder, depressed   . PTSD (post-traumatic stress disorder)   . Anxiety    Past Surgical History  Procedure Laterality Date  . Cesarean section     History reviewed. No pertinent family history. History  Substance Use Topics  . Smoking status: Never Smoker   . Smokeless tobacco: Never Used  . Alcohol Use: No   OB History   Grav Para Term Preterm Abortions TAB SAB Ect Mult Living                 Review of Systems  Constitutional: Negative for fever and chills.  Respiratory: Negative for shortness of breath.   Cardiovascular: Negative for chest pain.  Gastrointestinal: Negative for abdominal pain.  Genitourinary: Negative for dysuria, hematuria and flank pain.  Musculoskeletal: Positive for back pain. Negative for neck pain.  Skin: Negative for rash.  Neurological: Negative for headaches.  All other systems reviewed and are negative.    Allergies  Keflex  Home Medications   Current Outpatient Rx  Name  Route  Sig  Dispense  Refill  . FLUoxetine (PROZAC) 40 MG capsule   Oral   Take 40 mg by mouth daily.         Marland Kitchen. ibuprofen (ADVIL,MOTRIN) 200 MG tablet   Oral   Take 400 mg by mouth every 6 (six) hours as needed.          BP 126/76  Pulse  105  Temp(Src) 98.1 F (36.7 C) (Oral)  Resp 22  Ht 5\' 6"  (1.676 m)  Wt 187 lb (84.823 kg)  BMI 30.20 kg/m2  SpO2 100%  LMP 06/17/2013 Physical Exam  Constitutional: She is oriented to person, place, and time. She appears well-developed and well-nourished.  HENT:  Head: Normocephalic and atraumatic.  Eyes: EOM are normal. Pupils are equal, round, and reactive to light.  Neck: Neck supple.  Cardiovascular: Normal rate, regular rhythm and intact distal pulses.   Pulmonary/Chest: Effort normal and breath sounds normal. No respiratory distress.  Abdominal: Soft. Bowel sounds are normal. She exhibits no distension. There is no tenderness. There is no rebound.  Musculoskeletal: Normal range of motion. She exhibits no edema.  Tenderness over the lower lumbar spine and paralumbar spine. No midline deformity. DTRs lower extremities equal and intact. Sensorium to light touch intact throughout. Equal strength with dorsi plantar flexion and knee extension. Good range of motion lumbar spine.  Neurological: She is alert and oriented to person, place, and time.  Skin: Skin is warm and dry.    ED Course  Procedures (including critical care time) Labs Review Labs Reviewed - No data to display Imaging Review Dg Lumbar Spine Complete  07/09/2013   CLINICAL DATA:  Fall, back pain.  EXAM: LUMBAR SPINE - COMPLETE 4+ VIEW  COMPARISON:  CT of  the abdomen and pelvis May 03, 2013  FINDINGS: Five non rib-bearing lumbar-type vertebral bodies are intact and aligned with maintenance of the lumbar lordosis. No pars interarticularis defects. Intervertebral disc heights are normal. No destructive bony lesions. Mild L5-S1 facet arthropathy.  Sacroiliac joints are symmetric. Included prevertebral and paraspinal soft tissue planes are non-suspicious.  IMPRESSION: No acute lumbar spine fracture deformity nor malalignment.   Electronically Signed   By: Awilda Metro   On: 07/09/2013 04:43    Motrin, Flexeril and  ice provided  Plan discharge home with outpatient followup as needed. No red flags or indication for emergent MRI this time.  MDM  Diagnosis: Low back pain after fall  Evaluated with x-rays above for some midline tenderness. No fractures or bony deformity identified. Improved with medications. Plan discharge home with prescriptions for the same. Vital signs nurse's notes reviewed and considered.    Sunnie Nielsen, MD 07/09/13 [phone]

## 2013-08-07 ENCOUNTER — Emergency Department (HOSPITAL_COMMUNITY)
Admission: EM | Admit: 2013-08-07 | Discharge: 2013-08-07 | Disposition: A | Discharge status: Home / Self Care | Payer: Self-pay | Attending: Emergency Medicine | Admitting: Emergency Medicine

## 2013-08-07 ENCOUNTER — Encounter (HOSPITAL_COMMUNITY): Payer: Self-pay | Admitting: Emergency Medicine

## 2013-08-07 ENCOUNTER — Emergency Department (HOSPITAL_COMMUNITY): Payer: Self-pay

## 2013-08-07 DIAGNOSIS — F0781 Postconcussional syndrome: Secondary | ICD-10-CM | POA: Insufficient documentation

## 2013-08-07 DIAGNOSIS — F3130 Bipolar disorder, current episode depressed, mild or moderate severity, unspecified: Secondary | ICD-10-CM | POA: Insufficient documentation

## 2013-08-07 DIAGNOSIS — Z87448 Personal history of other diseases of urinary system: Secondary | ICD-10-CM | POA: Insufficient documentation

## 2013-08-07 DIAGNOSIS — H53149 Visual discomfort, unspecified: Secondary | ICD-10-CM | POA: Insufficient documentation

## 2013-08-07 DIAGNOSIS — F411 Generalized anxiety disorder: Secondary | ICD-10-CM | POA: Insufficient documentation

## 2013-08-07 DIAGNOSIS — M545 Low back pain, unspecified: Secondary | ICD-10-CM | POA: Insufficient documentation

## 2013-08-07 DIAGNOSIS — R112 Nausea with vomiting, unspecified: Secondary | ICD-10-CM | POA: Insufficient documentation

## 2013-08-07 DIAGNOSIS — Z79899 Other long term (current) drug therapy: Secondary | ICD-10-CM | POA: Insufficient documentation

## 2013-08-07 DIAGNOSIS — G8929 Other chronic pain: Secondary | ICD-10-CM | POA: Insufficient documentation

## 2013-08-07 DIAGNOSIS — R41840 Attention and concentration deficit: Secondary | ICD-10-CM | POA: Insufficient documentation

## 2013-08-07 DIAGNOSIS — R413 Other amnesia: Secondary | ICD-10-CM | POA: Insufficient documentation

## 2013-08-07 DIAGNOSIS — R42 Dizziness and giddiness: Secondary | ICD-10-CM | POA: Insufficient documentation
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
## 2013-08-07 NOTE — Discharge Instructions (Signed)
Post-Concussion Syndrome Post-concussion syndrome describes the symptoms that can occur after a head injury. These symptoms can last from weeks to months. CAUSES  It is not clear why some head injuries cause post-concussion syndrome. It can occur whether your head injury was mild or severe and whether you were wearing head protection or not.  SIGNS AND SYMPTOMS  Memory difficulties.  Dizziness.  Headaches.  Double vision or blurry vision.  Sensitivity to light.  Hearing difficulties.  Depression.  Tiredness.  Weakness.  Difficulty with concentration.  Difficulty sleeping or staying asleep.  Vomiting.  Poor balance or instability on your feet.  Slow reaction time.  Difficulty learning and remembering things you have heard. DIAGNOSIS  There is no test to determine whether you have post-concussion syndrome. Your health care provider may order an imaging scan of your brain, such as a CT scan, to check for other problems that may be causing your symptoms (such as severe injury inside your skull). TREATMENT  Usually, these problems disappear over time without medical care. Your health care provider may prescribe medicine to help ease your symptoms. It is important to follow up with a neurologist to evaluate your recovery and address any lingering symptoms or issues. HOME CARE INSTRUCTIONS   Only take over-the-counter or prescription medicines for pain, discomfort, or fever as directed by your health care provider. Do not take aspirin. Aspirin can slow blood clotting.  Sleep with your head slightly elevated to help with headaches.  Avoid any situation where there is potential for another head injury (football, hockey, soccer, basketball, martial arts, downhill snow sports, and horseback riding). Your condition will get worse every time you experience a concussion. You should avoid these activities until you are evaluated by the appropriate follow-up health care  providers.  Keep all follow-up appointments as directed by your health care provider. SEEK IMMEDIATE MEDICAL CARE IF:  You develop confusion or unusual drowsiness.  You cannot wake the injured person.  You develop nausea or persistent, forceful vomiting.  You feel like you are moving when you are not (vertigo).  You notice the injured person's eyes moving rapidly back and forth. This may be a sign of vertigo.  You have convulsions or faint.  You have severe, persistent headaches that are not relieved by medicine.  You cannot use your arms or legs normally.  Your pupils change size.  You have clear or bloody discharge from the nose or ears.  Your problems are getting worse, not better. MAKE SURE YOU:  Understand these instructions.  Will watch your condition.  Will get help right away if you are not doing well or get worse. Document Released: 12/02/2001 Document Revised: 04/02/2013 Document Reviewed: 12/29/2010 [facility] Patient Information 2014 [employer], [place].  Apply a compressive ACE bandage. Rest and elevate the affected painful area.  Apply cold compresses intermittently as needed.  As pain recedes, begin normal activities slowly as tolerated.  Call if symptoms persist.

## 2013-08-07 NOTE — ED Provider Notes (Signed)
Medical screening examination/treatment/procedure(s) were performed by non-physician practitioner and as supervising physician I was immediately available for consultation/collaboration.  EKG Interpretation   None         Ivyonna Hoelzel W. Bentleigh Waren, MD 08/07/13 1656 

## 2013-08-07 NOTE — Progress Notes (Signed)
[facility] provided pt with a list of primary care resources, [facility] information, and a [facility] Orange Card application to help patient establish primary care.  °
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
## 2013-09-03 ENCOUNTER — Emergency Department: Payer: Self-pay

## 2013-09-11 ENCOUNTER — Emergency Department: Payer: Self-pay | Admitting: Internal Medicine

## 2013-09-17 ENCOUNTER — Encounter (HOSPITAL_COMMUNITY): Payer: Self-pay | Admitting: Emergency Medicine

## 2013-09-17 ENCOUNTER — Emergency Department (HOSPITAL_COMMUNITY)
Admission: EM | Admit: 2013-09-17 | Discharge: 2013-09-17 | Disposition: A | Discharge status: Home / Self Care | Payer: Self-pay | Attending: Emergency Medicine | Admitting: Emergency Medicine

## 2013-09-17 DIAGNOSIS — F411 Generalized anxiety disorder: Secondary | ICD-10-CM | POA: Insufficient documentation

## 2013-09-17 DIAGNOSIS — F4310 Post-traumatic stress disorder, unspecified: Secondary | ICD-10-CM | POA: Insufficient documentation

## 2013-09-17 DIAGNOSIS — IMO0001 Reserved for inherently not codable concepts without codable children: Secondary | ICD-10-CM | POA: Insufficient documentation

## 2013-09-17 DIAGNOSIS — M25561 Pain in right knee: Secondary | ICD-10-CM

## 2013-09-17 DIAGNOSIS — F3130 Bipolar disorder, current episode depressed, mild or moderate severity, unspecified: Secondary | ICD-10-CM | POA: Insufficient documentation

## 2013-09-17 DIAGNOSIS — M25569 Pain in unspecified knee: Secondary | ICD-10-CM | POA: Insufficient documentation

## 2013-09-17 MED ORDER — NAPROXEN SODIUM 220 MG PO TABS
880.0000 mg | ORAL_TABLET | Freq: Two times a day (BID) | ORAL | Status: DC | PRN
Start: 2013-09-17 — End: 2015-07-15

## 2013-09-17 MED ORDER — OXYCODONE-ACETAMINOPHEN 5-325 MG PO TABS
1.0000 | ORAL_TABLET | Freq: Once | ORAL | Status: AC
  Administered 2013-09-17: 1 via ORAL
  Filled 2013-09-17: qty 1
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
## 2013-09-17 NOTE — ED Notes (Signed)
Per pt's sister, Dr. Ree ShayNieymyer saw pt in the past and suggested CT or MRI due to possible tendon/ligament injury.
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
## 2013-09-17 NOTE — ED Provider Notes (Signed)
Medical screening examination/treatment/procedure(s) were performed by non-physician practitioner and as supervising physician I was immediately available for consultation/collaboration.  Shellyann Wandrey L Troi Bechtold, MD 09/17/13 1642 

## 2013-09-17 NOTE — Discharge Instructions (Signed)
Please follow up with a specialist for further management of your knee injury.  Take ibuprofen or naproxen for pain.  Wear knee brace and crutches as needed.    Knee Pain Knee pain can be a result of an injury or other medical conditions. Treatment will depend on the cause of your pain. HOME CARE  Only take medicine as told by your doctor.  Keep a healthy weight. Being overweight can make the knee hurt more.  Stretch before exercising or playing sports.  If there is constant knee pain, change the way you exercise. Ask your doctor for advice.  Make sure shoes fit well. Choose the right shoe for the sport or activity.  Protect your knees. Wear kneepads if needed.  Rest when you are tired. GET HELP RIGHT AWAY IF:   Your knee pain does not stop.  Your knee pain does not get better.  Your knee joint feels hot to the touch.  You have a fever. MAKE SURE YOU:   Understand these instructions.  Will watch this condition.  Will get help right away if you are not doing well or get worse. Document Released: 09/08/2008 Document Revised: 09/04/2011 Document Reviewed: 09/08/2008 [facility] Patient Information 2014 [place], [place].   Emergency Department Resource Guide 1) Find a Doctor and Pay Out of Pocket Although you won't have to find out who is covered by your insurance plan, it is a good idea to ask around and get recommendations. You will then need to call the office and see if the doctor you have chosen will accept you as a new patient and what types of options they offer for patients who are self-pay. Some doctors offer discounts or will set up payment plans for their patients who do not have insurance, but you will need to ask so you aren't surprised when you get to your appointment.  2) Contact Your Local Health Department Not all health departments have doctors that can see patients for sick visits, but many do, so it is worth a call to see if yours does. If you don't know where  your local health department is, you can check in your phone book. The CDC also has a tool to help you locate your state's health department, and many state websites also have listings of all of their local health departments.  3) Find a Walk-in Clinic If your illness is not likely to be very severe or complicated, you may want to try a walk in clinic. These are popping up all over the country in pharmacies, drugstores, and shopping centers. They're usually staffed by nurse practitioners or physician assistants that have been trained to treat common illnesses and complaints. They're usually fairly quick and inexpensive. However, if you have serious medical issues or chronic medical problems, these are probably not your best option.  No Primary Care Doctor: - Call Health Connect at  [phone] - they can help you locate a primary care doctor that  accepts your insurance, provides certain services, etc. - Physician Referral Service- [phone]  Chronic Pain Problems: Organization         Address  Phone   Notes  [facility]  [phone] Patients need to be referred by their primary care doctor.   Medication Assistance: Organization         Address  Phone   Notes  [facility] Medication [facility] [address] [place] [place], [place] [address] [phone] --Must be a resident of [facility] --  Must have NO insurance coverage whatsoever (no Medicaid/ Medicare, etc.) -- The pt. MUST have a primary care doctor that directs their care regularly and follows them in the community   [facility]  [phone]   [employer]  [phone]    Agencies that provide inexpensive medical care: Organization         Address  Phone   Notes  [facility]  [phone]   [facility]    [phone]   [facility] [address] [place], [place] [address] [phone]   [facility] [address]  [place], [place] [phone]   Planned Parenthood    [phone]   [facility]    [phone]   [facility]  [address], [place] Phone:  [phone], Fax:  [phone] Hours of Operation:  9 am - 6 pm, M-F.  Also accepts Medicaid/Medicare and self-pay.  [facility]  [address], [place] Phone: [phone], Fax: [phone]. Hours of Operation:  8:30 am - 5:30 pm, M-F.  Also accepts Medicaid and self-pay.  [facility] [place] [address], [place] Phone: [phone]   [facility] [address] Natasha Bence [place], [place] [phone], Ext. [phone] Mondays & Thursdays: 7-9 AM.  First 15 patients are seen on a first come, first serve basis.    Medicaid-accepting [facility] Providers:  Organization         Address  Phone   Notes  [facility] [address], [place] [phone] Also accepts self-pay patients.  [facility] [address] Laurell Josephs [place], [place]  [phone]   [facility] [address], [place] [phone]   [facility] [address] [place] [phone]   Renaye Rakers [address], [place]   [phone] Only accepts [place] patients after they have their name applied to their card.   Self-Pay (no insurance) in [facility]:  Organization         Address  Phone   Notes  Sickle Cell Patients, [facility] [address] [place], [place] [phone]   [facility] [address] [place], [place] [phone]   [facility] [place]  [address], [place] [phone]   [facility]/Dr. Osei-Bonsu  [address] [place] or [address], [place] [phone] Phone number for both [place] and [place] locations is the same.  [facility] [address] [place] [phone]   [facility] [address] [place] or [address] [phone]   [facility] [address], [place] [phone], phone; [phone], fax Sees patients 1st and 3rd Saturday of every month.  Must not qualify for public or private insurance (i.e. Medicaid, Medicare, [place] Health Choice, Veterans' Benefits)  Household income should be no more than 200% of the poverty level The clinic cannot treat you if you are pregnant or think you are pregnant  Sexually transmitted diseases are not treated at the clinic.    Dental Care: Organization         Address  Phone  Notes  [facility] Department of [facility] [address] [place], [place] [phone] Accepts children up to age 14 who are enrolled in [place] or [place] Health Choice; pregnant women with a Medicaid card; and children who have applied for Medicaid or [place] Health Choice, but were declined, whose parents can pay a reduced fee at time of service.  [facility] of [facility]  [address], [place] [phone] Accepts children up to age 2 who are enrolled in [place] or [place] Health Choice; pregnant women with a Medicaid card; and children who have applied for Medicaid or  Health Choice, but were declined, whose parents can pay a reduced fee at time of service.  [facility]  [address] [place], [place] [phone] Patients are seen by appointment only. Walk-ins are not accepted. [facility] will see patients 75 years of age and older. Monday - Tuesday (8am-5pm) Most Wednesdays (8:30-5pm) $30 per visit, cash only  [facility] Adult Dental Access [facility]  [address], [facility] [phone] Patients are seen by appointment only. Walk-ins are not  accepted. [facility] will see patients 2 years of age and older. One Wednesday Evening (Monthly: Volunteer Based).  $30 per visit, cash only  [employer]  [phone] for adults; Children under age 9, call Graduate Pediatric Dentistry at [phone]. Children aged 81-14, please call [phone] to request a pediatric application.  Dental services are provided in all areas of dental care including fillings, crowns and bridges, complete and partial dentures, implants, gum treatment, root canals, and extractions. Preventive care is also provided. Treatment is provided to both adults and children. Patients are selected via a lottery and there is often a waiting list.   [facility] [address], [place]  [phone] [email or web address]   [facility] [address] [place], [place] [phone], Ext. [phone] Second and Fourth Thursday of each month, opens at 6:30 AM; Clinic ends at 9 AM.  Patients are seen on a first-come first-served basis, and a limited number are seen during each clinic.   [facility]  [address] Ether Griffins [place], [place] [phone]   Eligibility Requirements You must have lived in [place], [place], or [place] counties for at least the last three months.   You cannot be eligible for state or federal sponsored [place], including [employer], [place], or [employer].   You generally cannot be eligible for healthcare insurance through your employer.    How to apply: Eligibility screenings are held every Tuesday and Wednesday afternoon from 1:00 pm until 4:00 pm. You do not need an appointment for the interview!  [facility] [address] [place], [place] [phone]   [facility] Health Department  [phone]   [facility] Health Department  [phone]   [facility] Health Department  [phone]    Behavioral Health Resources in the  Community: Intensive Outpatient Programs Organization         Address  Phone  Notes  [facility] [address], [place], [place] [phone]   [facility] Outpatient [address], [place], [place] [phone]   [facility] [address] [place], [place]  [phone]   [facility] [address]  [place], [place] [phone] or [phone]   Substance Abuse Resources Organization  Address  Phone  Notes  [facility]  [phone]   [facility]  [phone]   [facility]  [phone]   [facility]  [phone]   Residential & Outpatient Substance Abuse Program  [phone]   Psychological Services Organization         Address  Phone  Notes  [facility]  [phone]- [phone]   [facility]  [phone]- [phone]   [facility] [address], [place] [phone] or [phone]    Mobile Crisis Teams Organization         Address  Phone  Notes  [facility], Mobile Crisis Care Unit  [phone]   [facility]  [address] [place], [place] [phone]   Doristine LocksSharon DeEsch [address] [place] [phone]    Self-Help/Support Groups Organization         Address  Phone             Notes  [facility] - variety of support groups  [phone] Call for more information  Narcotics Anonymous (NA), [facility] [address], [place]  2 meetings at this location   Statisticianesidential Treatment Programs Organization         Address  Phone  Notes  [facility] [address],    [place]  [phone]   [facility]  [address], [place] [phone], [place], [place] [phone]   [facility] [address], [place] [phone] Admissions: 8am-3pm M-F  [facility] [address]    [place], [place] [phone]   [facility] [address], [place], [place] [phone]   [facility] [address]  [place], [place] [phone]   [facility] - Intensive Outpatient [address], [place], [place] [phone]   [facility] ([facility] [address]  [place], [place] [phone] or [phone]   [facility] ([facility]) [address] [place], [place] [phone] Accepts Medicaid  [facility] [address]  [place] [phone] Substance Abuse/Addiction Treatment   [facility] Resources Organization         Address  Phone  Notes  [facility]  [phone]   Angie FavaJulie Brannon, PhD [address] [place], [place]   [phone] or [phone]   [facility]   [address] [place], [place] [phone]   [facility] [address], [place], [place] [phone] Insurance/Medicaid/sponsorship through [facility]  [facility] [address]                                    [place], [place] [phone] Therapy/tele-psych/case  [facility] [address]   [place], [place] [phone]    Dr. Lolly MustacheArfeen  [phone]   [facility]  [employer] [facility] 1) [address], [place] 2) [address], [place] 3)  [address], [place] [phone]  [phone]   [facility] Child Abuse Hotline [phone] or [phone](336) [phone] (After Hours)

## 2013-10-13 ENCOUNTER — Encounter (HOSPITAL_COMMUNITY): Payer: Self-pay | Admitting: Emergency Medicine

## 2013-10-13 ENCOUNTER — Emergency Department (HOSPITAL_COMMUNITY): Payer: Self-pay

## 2013-10-13 ENCOUNTER — Emergency Department (HOSPITAL_COMMUNITY)
Admission: EM | Admit: 2013-10-13 | Discharge: 2013-10-13 | Disposition: A | Discharge status: Home / Self Care | Payer: Self-pay | Attending: Emergency Medicine | Admitting: Emergency Medicine

## 2013-10-13 DIAGNOSIS — F319 Bipolar disorder, unspecified: Secondary | ICD-10-CM | POA: Insufficient documentation

## 2013-10-13 DIAGNOSIS — Z791 Long term (current) use of non-steroidal anti-inflammatories (NSAID): Secondary | ICD-10-CM | POA: Insufficient documentation

## 2013-10-13 DIAGNOSIS — S8990XA Unspecified injury of unspecified lower leg, initial encounter: Secondary | ICD-10-CM | POA: Insufficient documentation

## 2013-10-13 DIAGNOSIS — Z79899 Other long term (current) drug therapy: Secondary | ICD-10-CM | POA: Insufficient documentation

## 2013-10-13 DIAGNOSIS — F411 Generalized anxiety disorder: Secondary | ICD-10-CM | POA: Insufficient documentation

## 2013-10-13 DIAGNOSIS — R296 Repeated falls: Secondary | ICD-10-CM | POA: Insufficient documentation

## 2013-10-13 DIAGNOSIS — Y9389 Activity, other specified: Secondary | ICD-10-CM | POA: Insufficient documentation

## 2013-10-13 DIAGNOSIS — S60212A Contusion of left wrist, initial encounter: Secondary | ICD-10-CM

## 2013-10-13 DIAGNOSIS — S99919A Unspecified injury of unspecified ankle, initial encounter: Secondary | ICD-10-CM

## 2013-10-13 DIAGNOSIS — F4310 Post-traumatic stress disorder, unspecified: Secondary | ICD-10-CM | POA: Insufficient documentation

## 2013-10-13 DIAGNOSIS — S99929A Unspecified injury of unspecified foot, initial encounter: Secondary | ICD-10-CM

## 2013-10-13 DIAGNOSIS — S60219A Contusion of unspecified wrist, initial encounter: Secondary | ICD-10-CM | POA: Insufficient documentation

## 2013-10-13 DIAGNOSIS — M25561 Pain in right knee: Secondary | ICD-10-CM

## 2013-10-13 DIAGNOSIS — Z87448 Personal history of other diseases of urinary system: Secondary | ICD-10-CM | POA: Insufficient documentation

## 2013-10-13 DIAGNOSIS — Y9289 Other specified places as the place of occurrence of the external cause: Secondary | ICD-10-CM | POA: Insufficient documentation

## 2013-10-13 DIAGNOSIS — X500XXA Overexertion from strenuous movement or load, initial encounter: Secondary | ICD-10-CM | POA: Insufficient documentation

## 2013-10-13 MED ORDER — OXYCODONE-ACETAMINOPHEN 5-325 MG PO TABS: 1.0000 | ORAL_TABLET | Freq: Four times a day (QID) | ORAL | Status: DC | PRN

## 2013-10-13 NOTE — Discharge Instructions (Signed)
Recommend aleve, ice, rest, and elevation of both your knee and left wrist. Take percocet for severe pain. Follow up with orthopedics regarding your knee pain and a hand specialist if symptoms in your L wrist worsen.  RICE: Routine Care for Injuries The routine care of many injuries includes Rest, Ice, Compression, and Elevation (RICE). HOME CARE INSTRUCTIONS  Rest is needed to allow your body to heal. Routine activities can usually be resumed when comfortable. Injured tendons and bones can take up to 6 weeks to heal. Tendons are the cord-like structures that attach muscle to bone.  Ice following an injury helps keep the swelling down and reduces pain.  Put ice in a plastic bag.  Place a towel between your skin and the bag.  Leave the ice on for 15-20 minutes, 03-04 times a day. Do this while awake, for the first 24 to 48 hours. After that, continue as directed by your caregiver.  Compression helps keep swelling down. It also gives support and helps with discomfort. If an elastic bandage has been applied, it should be removed and reapplied every 3 to 4 hours. It should not be applied tightly, but firmly enough to keep swelling down. Watch fingers or toes for swelling, bluish discoloration, coldness, numbness, or excessive pain. If any of these problems occur, remove the bandage and reapply loosely. Contact your caregiver if these problems continue.  Elevation helps reduce swelling and decreases pain. With extremities, such as the arms, hands, legs, and feet, the injured area should be placed near or above the level of the heart, if possible. SEEK IMMEDIATE MEDICAL CARE IF:  You have persistent pain and swelling.  You develop redness, numbness, or unexpected weakness.  Your symptoms are getting worse rather than improving after several days. These symptoms may indicate that further evaluation or further X-rays are needed. Sometimes, X-rays may not show a small broken bone (fracture) until 1  week or 10 days later. Make a follow-up appointment with your caregiver. Ask when your X-ray results will be ready. Make sure you get your X-ray results. Document Released: 09/24/2000 Document Revised: 09/04/2011 Document Reviewed: 11/11/2010 [facility] Patient Information 2014 [place].

## 2013-10-13 NOTE — ED Provider Notes (Signed)
CSN: [id number]     Arrival date & time 10/13/13  1824 History   None    This chart was scribed for non-physician practitioner, Antony Madura, PA-C working with Gavin Pound. Oletta Lamas, MD by Arlan Organ, ED Scribe. This patient was seen in room WTR8/WTR8 and the patient's care was started at 9:40 PM.   Chief Complaint  Patient presents with  . Finger Injury  . Knee Pain   The history is provided by the patient. No language interpreter was used.    HPI Comments: Sandra Jensen is a 43 y.o. female who presents to the Emergency Department complaining of a L forearm injury sustained 1 day ago. Pt states she was attempting to catch her sister from falling and twisted her L arm as a result. States she also fell on her R knee today around 4:30 PM. Pt admits to ongoing pain to the R knee sustained from a prior fall. She states she was told to follow up with an orthopedist, however, she currently does not have insurance to cover charges. Pt states her R knee pain has progressively worsened since recent incident. She has tried OTC Aleve and prescribed pain medication with no noticeable improvement. At this time she denies any fever or chills, extremity swelling, and extremity numbness. She here PMHx includes depression, Bipolar 1 disorder, PTSD, and anxiety. No other concerns this visit.   Past Medical History  Diagnosis Date  . Kidney infection   . Depression   . Bipolar 1 disorder, depressed   . PTSD (post-traumatic stress disorder)   . Anxiety    Past Surgical History  Procedure Laterality Date  . Cesarean section     History reviewed. No pertinent family history. History  Substance Use Topics  . Smoking status: Never Smoker   . Smokeless tobacco: Never Used  . Alcohol Use: No   OB History   Grav Para Term Preterm Abortions TAB SAB Ect Mult Living                  Review of Systems  Constitutional: Negative for fever and chills.  HENT: Negative for congestion.   Eyes: Negative for  redness.  Respiratory: Negative for cough.   Musculoskeletal: Positive for arthralgias.  Skin: Negative for rash.  Psychiatric/Behavioral: Negative for confusion.      Allergies  Keflex and Vicodin  Home Medications   Prior to Admission medications   Medication Sig Start Date End Date Taking? Authorizing Provider  albuterol (PROVENTIL HFA;VENTOLIN HFA) 108 (90 BASE) MCG/ACT inhaler Inhale 2 puffs into the lungs every 6 (six) hours as needed for wheezing or shortness of breath.    Historical Provider, MD  carbamazepine (TEGRETOL) 200 MG tablet Take 200 mg by mouth 2 (two) times daily.    Historical Provider, MD  clonazePAM (KLONOPIN) 1 MG tablet Take 1 mg by mouth 2 (two) times daily.    Historical Provider, MD  cyclobenzaprine (FLEXERIL) 5 MG tablet Take 5 mg by mouth 3 (three) times daily as needed for muscle spasms.    Historical Provider, MD  FLUoxetine (PROZAC) 20 MG tablet Take 20 mg by mouth daily. Pt takes a total of 60mg     Historical Provider, MD  FLUoxetine (PROZAC) 40 MG capsule Take 40 mg by mouth daily. Pt takes a total of 60mg  daily    Historical Provider, MD  ibuprofen (ADVIL,MOTRIN) 200 MG tablet Take 400 mg by mouth every 6 (six) hours as needed for mild pain.     Historical Provider,  MD  naproxen sodium (ANAPROX) 220 MG tablet Take 4 tablets (880 mg total) by mouth 2 (two) times daily as needed (pain). 09/17/13   Fayrene HelperBowie Tran, PA-C  oxyCODONE-acetaminophen (PERCOCET/ROXICET) 5-325 MG per tablet Take 1 tablet by mouth every 4 (four) hours as needed for severe pain.    Historical Provider, MD  risperiDONE (RISPERDAL) 1 MG tablet Take 1 mg by mouth at bedtime.    Historical Provider, MD   Triage Vitals: BP 112/77  Pulse 91  Temp(Src) 98.1 F (36.7 C) (Oral)  Resp 18  SpO2 100%  LMP 09/16/2013   Physical Exam  Nursing note and vitals reviewed. Constitutional: She is oriented to person, place, and time. She appears well-developed and well-nourished. No distress.  HENT:   Head: Normocephalic and atraumatic.  Mouth/Throat: Oropharynx is clear and moist. No oropharyngeal exudate.  Eyes: Conjunctivae and EOM are normal. No scleral icterus.  Neck: Normal range of motion.  Cardiovascular: Normal rate, regular rhythm and intact distal pulses.   Distal radial pulse in LUE 2+. Capillary refill normal in all digits of L hand. DP and PT pulses 2+ in RLE.  Pulmonary/Chest: Effort normal. No respiratory distress.  Musculoskeletal: She exhibits tenderness.       Left wrist: She exhibits decreased range of motion (secondary to pain), tenderness and swelling. She exhibits no bony tenderness, no effusion, no crepitus and no deformity.       Right knee: She exhibits swelling. She exhibits normal range of motion, no effusion, no deformity, no erythema, normal alignment, no LCL laxity and no MCL laxity. Tenderness found.       Left forearm: She exhibits tenderness (distal LUE) and swelling (distal LUE). She exhibits no bony tenderness and no deformity.       Left hand: Normal.       Right upper leg: Normal.       Right lower leg: Normal. She exhibits no tenderness (no calf tenderness).  Neurological: She is alert and oriented to person, place, and time. She exhibits normal muscle tone. Coordination normal.  GCS 15. Patient moves extremities without ataxia. Sensation to light touch intact in all extremities.  Skin: Skin is warm and dry. No rash noted. She is not diaphoretic. No pallor.  Mild ecchymosis to dorsal L wrist.  Psychiatric: She has a normal mood and affect. Her behavior is normal.    ED Course  Procedures (including critical care time)  DIAGNOSTIC STUDIES: Oxygen Saturation is 100% on RA, Normal by my interpretation.    COORDINATION OF CARE: 9:41 PM- Will order X-Ray of L wrist and R knee. Discussed treatment plan with pt at bedside and pt agreed to plan.     Labs Review Labs Reviewed - No data to display  Imaging Review Dg Wrist Complete Left  10/13/2013    CLINICAL DATA:  Pain.  EXAM: LEFT WRIST - COMPLETE 3+ VIEW  COMPARISON:  DG HAND COMPLETE*L* dated 09/22/2012  FINDINGS: There is no evidence of fracture or dislocation. There is no evidence of arthropathy or other focal bone abnormality. Soft tissue swelling without subcutaneous gas or radiopaque foreign bodies.  IMPRESSION: No acute fracture deformity nor dislocation.   Electronically Signed   By: Awilda Metroourtnay  Bloomer   On: 10/13/2013 22:24   Dg Knee Complete 4 Views Right  10/13/2013   CLINICAL DATA:  Fall with right knee pain.  EXAM: RIGHT KNEE - COMPLETE 4+ VIEW  COMPARISON:  None.  FINDINGS: There is no evidence of fracture, dislocation, or joint effusion.  There is no evidence of arthropathy or other focal bone abnormality. Soft tissues are unremarkable.  IMPRESSION: Negative.   Electronically Signed   By: Burman NievesWilliam  Stevens M.D.   On: 10/13/2013 22:23     EKG Interpretation None      MDM   Final diagnoses:  Contusion of wrist, left  Knee pain, right    Uncomplicated L wrist contusion and acute on chronic R knee pain. No evidence of septic joint or underlying infection/cellulitic process. Patient neurovascularly intact. No gross sensory deficits. Imaging negative for acute findings. Patient with knee immobilizer and crutches. She has been given wrist brace for stability. RICE advised and short course of Percocet given for pain control; patient recently had 90 Klonopin filled. Orthopedic f/u urged and return precautions discussed. Patient agreeable to plan with no unaddressed concerns.  I personally performed the services described in this documentation, which was scribed in my presence. The recorded information has been reviewed and is accurate.    Antony MaduraKelly Ilias Stcharles, PA-C 10/27/13 2047

## 2013-10-13 NOTE — ED Notes (Signed)
Pt is c/o bruised left wrist being bruised and swollen causing her finger to feel asleep and has decreased movement  Pt fell on her right knee today about 430  Pt states her knee feels like velcro and she heard a snap  Pt states it is not broken but painful
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
## 2013-10-27 NOTE — ED Provider Notes (Signed)
Medical screening examination/treatment/procedure(s) were performed by non-physician practitioner and as supervising physician I was immediately available for consultation/collaboration.   EKG Interpretation None        Gavin PoundMichael Y. Oletta LamasGhim, MD 10/27/13 2243

## 2013-12-03 LAB — CBC
HCT: 38.2 % (ref 35.0–47.0)
HGB: 13 g/dL (ref 12.0–16.0)
MCH: 29.8 pg (ref 26.0–34.0)
MCHC: 34.1 g/dL (ref 32.0–36.0)
MCV: 87 fL (ref 80–100)
PLATELETS: 203 10*3/uL (ref 150–440)
RBC: 4.37 10*6/uL (ref 3.80–5.20)
RDW: 13.8 % (ref 11.5–14.5)
WBC: 6.2 10*3/uL (ref 3.6–11.0)

## 2013-12-03 LAB — URINALYSIS, COMPLETE
BILIRUBIN, UR: NEGATIVE
Bacteria: NONE SEEN
Blood: NEGATIVE
Glucose,UR: NEGATIVE mg/dL (ref 0–75)
Ketone: NEGATIVE
LEUKOCYTE ESTERASE: NEGATIVE
NITRITE: NEGATIVE
PH: 5 (ref 4.5–8.0)
Protein: NEGATIVE
RBC,UR: NONE SEEN /HPF (ref 0–5)
Specific Gravity: 1.027 (ref 1.003–1.030)
WBC UR: 1 /HPF (ref 0–5)

## 2013-12-03 LAB — COMPREHENSIVE METABOLIC PANEL
ANION GAP: 5 — AB (ref 7–16)
Albumin: 3.8 g/dL (ref 3.4–5.0)
Alkaline Phosphatase: 63 U/L
BUN: 12 mg/dL (ref 7–18)
Bilirubin,Total: 0.3 mg/dL (ref 0.2–1.0)
CHLORIDE: 108 mmol/L — AB (ref 98–107)
Calcium, Total: 8.4 mg/dL — ABNORMAL LOW (ref 8.5–10.1)
Co2: 26 mmol/L (ref 21–32)
Creatinine: 0.69 mg/dL (ref 0.60–1.30)
EGFR (African American): >60
Glucose: 87 mg/dL (ref 65–99)
Osmolality: 277 (ref 275–301)
Potassium: 3.2 mmol/L — ABNORMAL LOW (ref 3.5–5.1)
SGOT(AST): 17 U/L (ref 15–37)
SGPT (ALT): 19 U/L (ref 12–78)
SODIUM: 139 mmol/L (ref 136–145)
Total Protein: 7.3 g/dL (ref 6.4–8.2)

## 2013-12-03 LAB — DRUG SCREEN, URINE
Amphetamines, Ur Screen: POSITIVE (ref ?–1000)
BARBITURATES, UR SCREEN: NEGATIVE (ref ?–200)
BENZODIAZEPINE, UR SCRN: NEGATIVE (ref ?–200)
Cannabinoid 50 Ng, Ur ~~LOC~~: NEGATIVE (ref ?–50)
Cocaine Metabolite,Ur ~~LOC~~: NEGATIVE (ref ?–300)
MDMA (ECSTASY) UR SCREEN: NEGATIVE (ref ?–500)
Methadone, Ur Screen: NEGATIVE (ref ?–300)
Opiate, Ur Screen: NEGATIVE (ref ?–300)
Phencyclidine (PCP) Ur S: NEGATIVE (ref ?–25)
Tricyclic, Ur Screen: NEGATIVE (ref ?–1000)

## 2013-12-03 LAB — ETHANOL: Ethanol: <3 mg/dL

## 2013-12-03 LAB — SALICYLATE LEVEL: Salicylates, Serum: <1.7 mg/dL

## 2013-12-03 LAB — ACETAMINOPHEN LEVEL: Acetaminophen: <2 ug/mL

## 2013-12-04 ENCOUNTER — Inpatient Hospital Stay: Payer: Self-pay | Admitting: Psychiatry

## 2013-12-07 LAB — CARBAMAZEPINE LEVEL, TOTAL: Carbamazepine: 4.9 ug/mL (ref 4.0–12.0)

## 2014-03-02 ENCOUNTER — Emergency Department: Payer: Self-pay | Admitting: Emergency Medicine

## 2014-06-05 ENCOUNTER — Ambulatory Visit: Payer: No Typology Code available for payment source | Attending: Internal Medicine | Admitting: Internal Medicine

## 2014-06-05 ENCOUNTER — Encounter: Payer: Self-pay | Admitting: Internal Medicine

## 2014-06-05 VITALS — BP 119/86 | HR 94 | Temp 97.9°F | Resp 16 | Ht 65.0 in | Wt 217.0 lb

## 2014-06-05 DIAGNOSIS — F4310 Post-traumatic stress disorder, unspecified: Secondary | ICD-10-CM

## 2014-06-05 DIAGNOSIS — M25561 Pain in right knee: Secondary | ICD-10-CM

## 2014-06-05 DIAGNOSIS — F319 Bipolar disorder, unspecified: Secondary | ICD-10-CM

## 2014-06-05 NOTE — Patient Instructions (Signed)
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
## 2014-06-17 ENCOUNTER — Other Ambulatory Visit: Payer: Self-pay

## 2014-06-23 ENCOUNTER — Other Ambulatory Visit: Payer: Self-pay

## 2014-07-07 ENCOUNTER — Ambulatory Visit: Payer: Self-pay
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
## 2015-05-14 ENCOUNTER — Other Ambulatory Visit: Payer: Self-pay

## 2015-05-14 DIAGNOSIS — Z1231 Encounter for screening mammogram for malignant neoplasm of breast: Secondary | ICD-10-CM

## 2015-06-08 ENCOUNTER — Ambulatory Visit: Payer: Self-pay

## 2015-06-14 ENCOUNTER — Other Ambulatory Visit: Payer: Self-pay | Admitting: Student

## 2015-06-14 DIAGNOSIS — M25561 Pain in right knee: Secondary | ICD-10-CM

## 2015-06-23 ENCOUNTER — Other Ambulatory Visit: Payer: Self-pay

## 2015-07-01 ENCOUNTER — Inpatient Hospital Stay: Admission: RE | Admit: 2015-07-01 | Payer: Self-pay | Source: Ambulatory Visit

## 2015-07-12 ENCOUNTER — Inpatient Hospital Stay: Admission: RE | Admit: 2015-07-12 | Payer: Self-pay | Source: Ambulatory Visit

## 2015-07-15 ENCOUNTER — Encounter: Payer: Self-pay | Admitting: Emergency Medicine

## 2015-07-15 ENCOUNTER — Emergency Department: Payer: Medicare Other

## 2015-07-15 ENCOUNTER — Emergency Department
Admission: EM | Admit: 2015-07-15 | Discharge: 2015-07-15 | Disposition: A | Discharge status: Home / Self Care | Payer: Medicare Other | Attending: Emergency Medicine | Admitting: Emergency Medicine

## 2015-07-15 DIAGNOSIS — W01198A Fall on same level from slipping, tripping and stumbling with subsequent striking against other object, initial encounter: Secondary | ICD-10-CM | POA: Diagnosis not present

## 2015-07-15 DIAGNOSIS — S0003XA Contusion of scalp, initial encounter: Secondary | ICD-10-CM | POA: Insufficient documentation

## 2015-07-15 DIAGNOSIS — Y9289 Other specified places as the place of occurrence of the external cause: Secondary | ICD-10-CM | POA: Insufficient documentation

## 2015-07-15 DIAGNOSIS — Z79899 Other long term (current) drug therapy: Secondary | ICD-10-CM | POA: Diagnosis not present

## 2015-07-15 DIAGNOSIS — S5002XA Contusion of left elbow, initial encounter: Secondary | ICD-10-CM

## 2015-07-15 DIAGNOSIS — S51012A Laceration without foreign body of left elbow, initial encounter: Secondary | ICD-10-CM | POA: Insufficient documentation

## 2015-07-15 DIAGNOSIS — Y9389 Activity, other specified: Secondary | ICD-10-CM | POA: Insufficient documentation

## 2015-07-15 DIAGNOSIS — Z7982 Long term (current) use of aspirin: Secondary | ICD-10-CM | POA: Diagnosis not present

## 2015-07-15 DIAGNOSIS — Y998 Other external cause status: Secondary | ICD-10-CM | POA: Insufficient documentation

## 2015-07-15 DIAGNOSIS — S59902A Unspecified injury of left elbow, initial encounter: Secondary | ICD-10-CM | POA: Diagnosis present

## 2015-07-15 MED ORDER — BACITRACIN ZINC 500 UNIT/GM EX OINT
TOPICAL_OINTMENT | CUTANEOUS | Status: AC
Start: 2015-07-15 — End: 2015-07-15
  Filled 2015-07-15: qty 0.9

## 2015-07-15 MED ORDER — OXYCODONE HCL 5 MG PO TABS
5.0000 mg | ORAL_TABLET | Freq: Once | ORAL | Status: AC
  Administered 2015-07-15: 5 mg via ORAL
  Filled 2015-07-15: qty 1

## 2015-07-15 MED ORDER — NAPROXEN 500 MG PO TABS: 500.0000 mg | ORAL_TABLET | Freq: Two times a day (BID) | ORAL | Status: DC

## 2015-07-15 MED ORDER — BACITRACIN ZINC 500 UNIT/GM EX OINT
TOPICAL_OINTMENT | Freq: Once | CUTANEOUS | Status: AC
  Administered 2015-07-15: 22:00:00 via TOPICAL

## 2015-07-15 MED ORDER — LIDOCAINE-EPINEPHRINE (PF) 1 %-1:200000 IJ SOLN
30.0000 mL | Freq: Once | INTRAMUSCULAR | Status: DC
  Filled 2015-07-15: qty 30

## 2015-07-15 MED ORDER — OXYCODONE HCL 5 MG PO TABS: 5.0000 mg | ORAL_TABLET | Freq: Four times a day (QID) | ORAL | Status: DC | PRN

## 2015-07-15 NOTE — ED Notes (Addendum)
Pt tripped and fell. Has lac left elbow that per report was spurting blood. Not bleeding now. Dressing, ace wrap applied at home.Also with bruise posterior head. 10/10 pain left elbow, 9/10 head. + radial pulse left.

## 2015-07-15 NOTE — ED Notes (Signed)
Wound cleansed with soap and water, bandaid applied, and sling applied.

## 2015-07-15 NOTE — ED Provider Notes (Signed)
[facility] Emergency Department Provider Note  ____________________________________________  Time seen: Approximately 6:53 PM  I have reviewed the triage vital signs and the nursing notes.   HISTORY  Chief Complaint Fall   HPI Sandra Jensen is a 45 y.o. female patient is here with complaint of left elbow pain along with laceration after falling in her trailer at approximately 4:00 today. Patient states that there was "spurting blood", there is no bleeding at present. Patient also hit her head but denies any loss of consciousness and also relative present with her confirms this. Patient denies any nausea, vomiting, dizziness, visual change. She has remained alert as reported by family members. She denies use of blood thinners. Patient's last tetanus shot was one year ago. Currently she rates her pain as a 10 out of 10.   Past Medical History  Diagnosis Date  . Kidney infection   . Depression   . Bipolar 1 disorder, depressed (HCC)   . PTSD (post-traumatic stress disorder)   . Anxiety     Patient Active Problem List   Diagnosis Date Noted  . Bipolar disorder (HCC) 06/05/2014  . PTSD (post-traumatic stress disorder) 06/05/2014    Past Surgical History  Procedure Laterality Date  . Cesarean section      Current Outpatient Rx  Name  Route  Sig  Dispense  Refill  . albuterol (PROVENTIL HFA;VENTOLIN HFA) 108 (90 BASE) MCG/ACT inhaler   Inhalation   Inhale 2 puffs into the lungs every 6 (six) hours as needed for wheezing or shortness of breath.         . Aspirin-Salicylamide-Caffeine (BC HEADACHE POWDER PO)   Oral   Take by mouth.         . carbamazepine (TEGRETOL) 200 MG tablet   Oral   Take 200 mg by mouth 2 (two) times daily.         . clonazePAM (KLONOPIN) 1 MG tablet   Oral   Take 1 mg by mouth 3 (three) times daily.          . cyclobenzaprine (FLEXERIL) 5 MG tablet   Oral   Take 5 mg by mouth 3 (three) times daily as needed  for muscle spasms.         Marland Kitchen FLUoxetine (PROZAC) 20 MG tablet   Oral   Take 20 mg by mouth daily. Pt takes a total of          . FLUoxetine (PROZAC) 40 MG capsule   Oral   Take 40 mg by mouth daily. Pt takes a total of  daily         . fluPHENAZine (PROLIXIN) 5 MG tablet   Oral   Take 5 mg by mouth 2 (two) times daily.         . Iron-Vitamins (GERITOL COMPLETE PO)   Oral   Take by mouth.         . naproxen (NAPROSYN) 500 MG tablet   Oral   Take 1 tablet (500 mg total) by mouth 2 (two) times daily with a meal.   30 tablet   0   . oxyCODONE (ROXICODONE) 5 MG immediate release tablet   Oral   Take 1 tablet (5 mg total) by mouth every 6 (six) hours as needed for severe pain.   10 tablet   0     Allergies Keflex; Tylenol; and Vicodin  Family History  Problem Relation Age of Onset  . Diabetes Paternal Aunt  Social History Social History  Substance Use Topics  . Smoking status: Never Smoker   . Smokeless tobacco: Never Used  . Alcohol Use: No    Review of Systems Constitutional: No fever/chills Eyes: No visual changes. ENT: No trauma Cardiovascular: Denies chest pain. Respiratory: Denies shortness of breath. Gastrointestinal: No abdominal pain.  No nausea, no vomiting.   Musculoskeletal: Negative for back pain. Positive left elbow pain. Skin: Negative for rash. Positive for laceration. Neurological: Positive for headaches, no focal weakness or numbness.  10-point ROS otherwise negative.  ____________________________________________   PHYSICAL EXAM:  VITAL SIGNS: ED Triage Vitals  Enc Vitals Group     BP 07/15/15 1810 120/73 mmHg     Pulse Rate 07/15/15 1810 75     Resp --      Temp 07/15/15 1810 98 F (36.7 C)     Temp Source 07/15/15 1810 Oral     SpO2 07/15/15 1810 98 %     Weight 07/15/15 1810 150 lb (68.04 kg)     Height 07/15/15 1810  (1.676 m)     Head Cir --      Peak Flow --      Pain Score 07/15/15 1811 10      Pain Loc --      Pain Edu? --      Excl. in GC? --     Constitutional: Alert and oriented. Well appearing and in no acute distress. Eyes: Conjunctivae are normal. PERRL. EOMI. Head: Atraumatic. Nose: No congestion/rhinnorhea. Mouth/Throat: Mucous membranes are moist.  Oropharynx non-erythematous. Neck: No stridor.  No pain cervical spine to palpation posteriorly and range of motion is without restriction. Cardiovascular: Normal rate, regular rhythm. Grossly normal heart sounds.  Good peripheral circulation. Respiratory: Normal respiratory effort.  No retractions. Lungs CTAB. Gastrointestinal: Soft and nontender. No distention.  Musculoskeletal: Moves extremities without any difficulty with the exception of left elbow and upper extremity secondary to pain. Patient is able to move digits distal to her injury. Neurologic:  Normal speech and language. No gross focal neurologic deficits are appreciated. No gait instability. Skin:  Skin is warm, dry. No rash noted. There is a 0.5 cm superficial laceration on the lateral aspect of the left elbow. No active bleeding is noted. Range of motion does not affect the laceration site. No abrasions were noted on the scalp. Minimal tenderness is noted left posterior scalp. Psychiatric: Mood and affect are normal. Speech and behavior are normal.  ____________________________________________   LABS (all labs ordered are listed, but only abnormal results are displayed)  Labs Reviewed - No data to display  RADIOLOGY  Left elbow x-ray per radiologist shows no evidence of fracture, dislocation or joint effusion. I, Tommi Rumps, personally viewed and evaluated these images (plain radiographs) as part of my medical decision making, as well as reviewing the written report by the radiologist. ____________________________________________   PROCEDURES  Procedure(s) performed: None   Not sutured  Critical Care performed:  No  ____________________________________________   INITIAL IMPRESSION / ASSESSMENT AND PLAN / ED COURSE  Pertinent labs & imaging results that were available during my care of the patient were reviewed by me and considered in my medical decision making (see chart for details).  Area was cleaned. A sling was applied to the left arm for support and protection. Patient was given a limited amount of Percocet as needed for pain. She is to follow-up with her care doctor if any continued problems or Dr. Ernest Pine who is orthopedist on  call. ____________________________________________   FINAL CLINICAL IMPRESSION(S) / ED DIAGNOSES  Final diagnoses:  Contusion of left elbow, initial encounter  Laceration of left elbow, initial encounter  Scalp contusion, initial encounter      Tommi Rumps, PA-C 07/15/15 2209  Sharman Cheek, MD 07/15/15 2350

## 2015-07-15 NOTE — Discharge Instructions (Signed)
°  Elbow Contusion An elbow contusion is a deep bruise of the elbow. Contusions are the result of an injury that caused bleeding under the skin. The contusion may turn blue, purple, or yellow. Minor injuries will give you a painless contusion, but more severe contusions may stay painful and swollen for a few weeks.  CAUSES  An elbow contusion comes from a direct force to that area, such as falling on the elbow. SYMPTOMS   Swelling and redness of the elbow.  Bruising of the elbow area.  Tenderness or soreness of the elbow. DIAGNOSIS  You will have a physical exam and will be asked about your history. You may need an X-ray of your elbow to look for a broken bone (fracture).  TREATMENT  A sling or splint may be needed to support your injury. Resting, elevating, and applying cold compresses to the elbow area are often the best treatments for an elbow contusion. Over-the-counter medicines may also be recommended for pain control. HOME CARE INSTRUCTIONS   Put ice on the injured area.  Put ice in a plastic bag.  Place a towel between your skin and the bag.  Leave the ice on for 15-20 minutes, 03-04 times a day.  Only take over-the-counter or prescription medicines for pain, discomfort, or fever as directed by your caregiver.  Rest your injured elbow until the pain and swelling are better.  Elevate your elbow to reduce swelling.  Apply a compression wrap as directed by your caregiver. This can help reduce swelling and motion. You may remove the wrap for sleeping, showers, and baths. If your fingers become numb, cold, or blue, take the wrap off and reapply it more loosely.  Use your elbow only as directed by your caregiver. You may be asked to do range of motion exercises. Do them as directed.  See your caregiver as directed. It is very important to keep all follow-up appointments in order to avoid any long-term problems with your elbow, including chronic pain or inability to move your elbow  normally. SEEK IMMEDIATE MEDICAL CARE IF:   You have increased redness, swelling, or pain in your elbow.  Your swelling or pain is not relieved with medicines.  You have swelling of the hand and fingers.  You are unable to move your fingers or wrist.  You begin to lose feeling in your hand or fingers.  Your fingers or hand become cold or blue. MAKE SURE YOU:   Understand these instructions.  Will watch your condition.  Will get help right away if you are not doing well or get worse.   This information is not intended to replace advice given to you by your health care provider. Make sure you discuss any questions you have with your health care provider.   Document Released: 05/21/2006 Document Revised: 09/04/2011 Document Reviewed: 01/25/2015 Elsevier Interactive Patient Education [employer]    Keep area clean and dry and watch for signs of infection. Naproxen as needed for pain and inflammation. Oxycodone if needed for severe pain every 6 hours. Ice to elbow as needed for pain and swelling. Wear  sling for support. Follow-up with your doctor if any continued problems.

## 2015-08-02 ENCOUNTER — Other Ambulatory Visit: Payer: Self-pay

## 2015-08-07 ENCOUNTER — Other Ambulatory Visit: Payer: Self-pay

## 2015-08-14 ENCOUNTER — Ambulatory Visit
Admission: RE | Admit: 2015-08-14 | Discharge: 2015-08-14 | Disposition: A | Discharge status: Home / Self Care | Payer: Medicare Other | Source: Ambulatory Visit | Attending: Student | Admitting: Student

## 2015-08-14 DIAGNOSIS — M25561 Pain in right knee: Secondary | ICD-10-CM

## 2016-02-07 ENCOUNTER — Other Ambulatory Visit: Payer: Self-pay | Admitting: Adult Health

## 2016-02-07 DIAGNOSIS — Z1231 Encounter for screening mammogram for malignant neoplasm of breast: Secondary | ICD-10-CM

## 2016-03-31 ENCOUNTER — Other Ambulatory Visit: Payer: Self-pay | Admitting: Adult Health

## 2016-03-31 DIAGNOSIS — Z1231 Encounter for screening mammogram for malignant neoplasm of breast: Secondary | ICD-10-CM

## 2016-04-12 ENCOUNTER — Ambulatory Visit: Payer: Self-pay

## 2016-04-20 ENCOUNTER — Ambulatory Visit: Payer: Self-pay

## 2016-05-17 ENCOUNTER — Ambulatory Visit (HOSPITAL_COMMUNITY)
Admission: EM | Admit: 2016-05-17 | Discharge: 2016-05-17 | Disposition: A | Discharge status: Home / Self Care | Payer: Medicare Other | Attending: Emergency Medicine | Admitting: Emergency Medicine

## 2016-05-17 ENCOUNTER — Encounter (HOSPITAL_COMMUNITY): Payer: Self-pay | Admitting: Emergency Medicine

## 2016-05-17 ENCOUNTER — Ambulatory Visit (INDEPENDENT_AMBULATORY_CARE_PROVIDER_SITE_OTHER): Payer: Medicare Other

## 2016-05-17 DIAGNOSIS — S6000XA Contusion of unspecified finger without damage to nail, initial encounter: Secondary | ICD-10-CM

## 2016-05-17 DIAGNOSIS — S60221A Contusion of right hand, initial encounter: Secondary | ICD-10-CM | POA: Diagnosis not present

## 2016-05-17 MED ORDER — KETOROLAC TROMETHAMINE 60 MG/2ML IM SOLN
INTRAMUSCULAR | Status: AC
  Filled 2016-05-17: qty 2

## 2016-05-17 MED ORDER — KETOROLAC TROMETHAMINE 60 MG/2ML IM SOLN
60.0000 mg | Freq: Once | INTRAMUSCULAR | Status: AC
  Administered 2016-05-17: 60 mg via INTRAMUSCULAR

## 2016-05-17 NOTE — Discharge Instructions (Signed)
You were given a shot of Toradol today to help with pain and swelling. Wear ace wrap for comfort and support. Apply ice to area to help reduce swelling. May take Ibuprofen 800mg  or Aleve every 8 to 12 hours as needed for pain. Follow-up with your primary care provider in 2 to 3 days if not improving.

## 2016-05-17 NOTE — ED Triage Notes (Signed)
[facility] a [facility]. The patient reported that she slammed the middle finger of her right hand in a door today. The patient had good PMS with decreased ROM.

## 2016-05-17 NOTE — ED Provider Notes (Signed)
CSN: [id number]     Arrival date & time 05/17/16  1236 History   First MD Initiated Contact with Patient 05/17/16 1305     Chief Complaint  Patient presents with  . Finger Injury   (Consider location/radiation/quality/duration/timing/severity/associated sxs/prior Treatment) 45 year old female presents with injury to her right hand at base of middle finger. She closed the car door on her right hand/finger this morning and experienced immediate pain and swelling. Tried to put ice on area but too painful. Did not take anything else for pain. She is currently on Oxycodone 3 to 4 times a day for chronic pain. She can bend her finger and hand but with pain. No numbness or tingling noted.       Past Medical History:  Diagnosis Date  . Anxiety   . Bipolar 1 disorder, depressed (HCC)   . Depression   . Kidney infection   . PTSD (post-traumatic stress disorder)    Past Surgical History:  Procedure Laterality Date  . CESAREAN SECTION     Family History  Problem Relation Age of Onset  . Diabetes Paternal Aunt    Social History  Substance Use Topics  . Smoking status: Never Smoker  . Smokeless tobacco: Never Used  . Alcohol use No   OB History    No data available     Review of Systems  Constitutional: Negative for fever.  Musculoskeletal: Positive for arthralgias and joint swelling.  Skin: Negative for rash and wound.  Neurological: Negative for tremors, weakness, numbness and headaches.  Hematological: Negative for adenopathy.    Allergies  Keflex [cephalexin]; Tylenol [acetaminophen]; and Vicodin [hydrocodone-acetaminophen]  Home Medications   Prior to Admission medications   Medication Sig Start Date End Date Taking? Authorizing Provider  albuterol (PROVENTIL HFA;VENTOLIN HFA) 108 (90 BASE) MCG/ACT inhaler Inhale 2 puffs into the lungs every 6 (six) hours as needed for wheezing or shortness of breath.   Yes Historical Provider, MD  Aspirin-Salicylamide-Caffeine (BC  HEADACHE POWDER PO) Take by mouth.   Yes Historical Provider, MD  clonazePAM (KLONOPIN) 1 MG tablet Take 1 mg by mouth 3 (three) times daily.    Yes Historical Provider, MD  FLUoxetine (PROZAC) 20 MG tablet Take 20 mg by mouth daily. Pt takes a total of 60mg    Yes Historical Provider, MD  Iron-Vitamins (GERITOL COMPLETE PO) Take by mouth.   Yes Historical Provider, MD  oxyCODONE (ROXICODONE) 5 MG immediate release tablet Take 1 tablet (5 mg total) by mouth every 6 (six) hours as needed for severe pain. 07/15/15  Yes Tommi Rumpshonda L Summers, PA-C  carbamazepine (TEGRETOL) 200 MG tablet Take 200 mg by mouth 2 (two) times daily.    Historical Provider, MD  cyclobenzaprine (FLEXERIL) 5 MG tablet Take 5 mg by mouth 3 (three) times daily as needed for muscle spasms.    Historical Provider, MD  FLUoxetine (PROZAC) 40 MG capsule Take 40 mg by mouth daily. Pt takes a total of 60mg  daily    Historical Provider, MD  fluPHENAZine (PROLIXIN) 5 MG tablet Take 5 mg by mouth 2 (two) times daily.    Historical Provider, MD  naproxen (NAPROSYN) 500 MG tablet Take 1 tablet (500 mg total) by mouth 2 (two) times daily with a meal. 07/15/15   Tommi Rumpshonda L Summers, PA-C   Meds Ordered and Administered this Visit   Medications  ketorolac (TORADOL) injection 60 mg (60 mg Intramuscular Given 05/17/16 1350)    BP 124/76 (BP Location: Left Arm)   Pulse 76  Temp 98.4 F (36.9 C) (Oral)   Resp 18   LMP 03/31/2016 (Exact Date)   SpO2 97%  No data found.   Physical Exam  Constitutional: She is oriented to person, place, and time. She appears well-developed and well-nourished. She appears distressed (holding hand and rocking back and forth due to pain).  Musculoskeletal: She exhibits edema, tenderness and deformity.       Right hand: She exhibits decreased range of motion, tenderness and swelling. She exhibits normal two-point discrimination, normal capillary refill, no deformity and no laceration. Normal sensation noted. Normal  strength noted.       Hands: Tenderness and swelling of 3rd distal metacarpal joint. Has decreased range of motion of hand but able to flex 3rd finger at distal joints. No bruising noted. No laceration or abrasion. Good pulses and capillary refill. No neuro deficits noted.    Neurological: She is alert and oriented to person, place, and time. She has normal strength. No sensory deficit.  Skin: Skin is warm, dry and intact. Capillary refill takes less than 2 seconds.  Psychiatric: She has a normal mood and affect. Her behavior is normal.    Urgent Care Course   Clinical Course     Procedures (including critical care time)  Labs Review Labs Reviewed - No data to display  Imaging Review Dg Hand Complete Right  Result Date: 05/17/2016 CLINICAL DATA:  45 year old female with a history of hand injury EXAM: RIGHT HAND - COMPLETE 3+ VIEW COMPARISON:  None. FINDINGS: No acute displaced fracture. No focal soft tissue swelling. No radiopaque foreign body. No subluxation/ dislocation. No significant degenerative changes. IMPRESSION: Negative for acute bony abnormality. Signed, Yvone NeuJaime S. Loreta AveWagner, DO [facility] Electronically Signed   By: Gilmer MorJaime  Wagner D.O.   On: 05/17/2016 13:24     Visual Acuity Review  Right Eye Distance:   Left Eye Distance:   Bilateral Distance:    Right Eye Near:   Left Eye Near:    Bilateral Near:         MDM   1. Contusion of hand including fingers, right, initial encounter    Reviewed x-ray results with patient and her sister. No fracture but definite soft swelling present in imaging pictures although radiologist commented no soft tissue swelling. Gave Toradol 60mg  IM today to help with pain and swelling. Wear ace wrap for comfort and support. Apply ice to area to help reduce swelling. May take Ibuprofen 800mg  or Aleve every 8 to 12 hours as needed for pain. Follow-up with your primary care provider in 2  to 3 days if not improving.      Sudie GrumblingAnn Berry Boluwatife Mutchler, NP 05/17/16 [phone]

## 2016-07-24 ENCOUNTER — Ambulatory Visit (INDEPENDENT_AMBULATORY_CARE_PROVIDER_SITE_OTHER): Payer: Self-pay | Admitting: Orthopaedic Surgery

## 2016-07-25 ENCOUNTER — Ambulatory Visit (INDEPENDENT_AMBULATORY_CARE_PROVIDER_SITE_OTHER): Payer: Medicare Other | Admitting: Orthopaedic Surgery

## 2016-07-25 DIAGNOSIS — M25561 Pain in right knee: Secondary | ICD-10-CM

## 2016-07-25 DIAGNOSIS — M25562 Pain in left knee: Secondary | ICD-10-CM

## 2016-07-25 DIAGNOSIS — G8929 Other chronic pain: Secondary | ICD-10-CM
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
## 2017-03-29 ENCOUNTER — Other Ambulatory Visit (HOSPITAL_COMMUNITY): Payer: Self-pay | Admitting: Orthopedic Surgery

## 2017-04-02 ENCOUNTER — Other Ambulatory Visit (HOSPITAL_COMMUNITY): Payer: Self-pay | Admitting: Orthopedic Surgery

## 2017-04-02 DIAGNOSIS — M25562 Pain in left knee: Principal | ICD-10-CM

## 2017-04-02 DIAGNOSIS — G8929 Other chronic pain: Secondary | ICD-10-CM

## 2017-04-17 ENCOUNTER — Other Ambulatory Visit (HOSPITAL_COMMUNITY): Payer: Self-pay

## 2017-04-18 ENCOUNTER — Encounter (HOSPITAL_COMMUNITY): Payer: Self-pay | Admitting: *Deleted

## 2017-04-18 NOTE — Progress Notes (Signed)
Spoke with pt for pre-op call. Pt denies cardiac history, chest pain or sob. Hx of COPD and Asthma. Pt instructed not to smoke as of now until after procedure tomorrow.

## 2017-04-19 ENCOUNTER — Ambulatory Visit (HOSPITAL_COMMUNITY)
Admission: RE | Admit: 2017-04-19 | Discharge: 2017-04-19 | Disposition: A | Discharge status: Home / Self Care | Payer: Medicare Other | Source: Ambulatory Visit | Attending: Orthopedic Surgery | Admitting: Orthopedic Surgery

## 2017-04-19 ENCOUNTER — Ambulatory Visit (HOSPITAL_COMMUNITY): Payer: Medicare Other | Admitting: Certified Registered Nurse Anesthetist

## 2017-04-19 ENCOUNTER — Encounter (HOSPITAL_COMMUNITY)
Admission: RE | Disposition: A | Discharge status: Home / Self Care | Payer: Self-pay | Source: Ambulatory Visit | Attending: Orthopedic Surgery

## 2017-04-19 ENCOUNTER — Encounter (HOSPITAL_COMMUNITY): Payer: Self-pay | Admitting: Certified Registered Nurse Anesthetist

## 2017-04-19 DIAGNOSIS — X58XXXA Exposure to other specified factors, initial encounter: Secondary | ICD-10-CM | POA: Diagnosis not present

## 2017-04-19 DIAGNOSIS — G8929 Other chronic pain: Secondary | ICD-10-CM

## 2017-04-19 DIAGNOSIS — S83232A Complex tear of medial meniscus, current injury, left knee, initial encounter: Secondary | ICD-10-CM | POA: Diagnosis not present

## 2017-04-19 DIAGNOSIS — M1712 Unilateral primary osteoarthritis, left knee: Secondary | ICD-10-CM | POA: Insufficient documentation

## 2017-04-19 DIAGNOSIS — M25562 Pain in left knee: Principal | ICD-10-CM

## 2017-04-19 DIAGNOSIS — M7042 Prepatellar bursitis, left knee: Secondary | ICD-10-CM | POA: Diagnosis not present

## 2017-04-19 HISTORY — DX: Unspecified asthma, uncomplicated: J45.909

## 2017-04-19 HISTORY — PX: RADIOLOGY WITH ANESTHESIA: SHX6223

## 2017-04-19 HISTORY — DX: Nausea with vomiting, unspecified: R11.2

## 2017-04-19 HISTORY — DX: Cramp and spasm: R25.2

## 2017-04-19 HISTORY — DX: Other specified postprocedural states: Z98.890

## 2017-04-19 LAB — HCG, SERUM, QUALITATIVE: Preg, Serum: NEGATIVE

## 2017-04-19 LAB — CBC
HEMATOCRIT: 35.7 % — AB (ref 36.0–46.0)
HEMOGLOBIN: 11.6 g/dL — AB (ref 12.0–15.0)
MCH: 26.4 pg (ref 26.0–34.0)
MCHC: 32.5 g/dL (ref 30.0–36.0)
MCV: 81.1 fL (ref 78.0–100.0)
Platelets: 293 10*3/uL (ref 150–400)
RBC: 4.4 MIL/uL (ref 3.87–5.11)
RDW: 14.6 % (ref 11.5–15.5)
WBC: 8.2 10*3/uL (ref 4.0–10.5)

## 2017-04-19 SURGERY — MRI WITH ANESTHESIA
Anesthesia: General | Site: Knee | Laterality: Left

## 2017-04-19 MED ORDER — MEPERIDINE HCL 25 MG/ML IJ SOLN: 6.2500 mg | INTRAMUSCULAR | Status: DC | PRN

## 2017-04-19 MED ORDER — ONDANSETRON HCL 4 MG/2ML IJ SOLN: 4.0000 mg | Freq: Once | INTRAMUSCULAR | Status: DC | PRN

## 2017-04-19 MED ORDER — SCOPOLAMINE 1 MG/3DAYS TD PT72
MEDICATED_PATCH | TRANSDERMAL | Status: AC
  Filled 2017-04-19: qty 1

## 2017-04-19 MED ORDER — FENTANYL CITRATE (PF) 100 MCG/2ML IJ SOLN
INTRAMUSCULAR | Status: DC | PRN
  Administered 2017-04-19: 100 ug via INTRAVENOUS

## 2017-04-19 MED ORDER — MIDAZOLAM HCL 2 MG/2ML IJ SOLN
INTRAMUSCULAR | Status: DC | PRN
  Administered 2017-04-19: 2 mg via INTRAVENOUS

## 2017-04-19 MED ORDER — HYDROMORPHONE HCL 1 MG/ML IJ SOLN: 0.2500 mg | INTRAMUSCULAR | Status: DC | PRN

## 2017-04-19 MED ORDER — LACTATED RINGERS IV SOLN
INTRAVENOUS | Status: DC
  Administered 2017-04-19: 07:00:00 via INTRAVENOUS

## 2017-04-19 MED FILL — Dexamethasone Sodium Phosphate Inj 10 MG/ML: INTRAMUSCULAR | Qty: 1 | Status: AC

## 2017-04-19 MED FILL — Midazolam HCl Inj 2 MG/2ML (Base Equivalent): INTRAMUSCULAR | Qty: 2 | Status: AC

## 2017-04-19 MED FILL — Lidocaine HCl IV Inj 20 MG/ML: INTRAVENOUS | Qty: 5 | Status: AC

## 2017-04-19 MED FILL — Propofol IV Emul 200 MG/20ML (10 MG/ML): INTRAVENOUS | Qty: 20 | Status: AC

## 2017-04-19 MED FILL — Fentanyl Citrate Preservative Free (PF) Inj 100 MCG/2ML: INTRAMUSCULAR | Qty: 2 | Status: AC

## 2017-04-19 MED FILL — Ondansetron HCl Inj 4 MG/2ML (2 MG/ML): INTRAMUSCULAR | Qty: 2 | Status: AC

## 2017-04-19 NOTE — Transfer of Care (Signed)
Immediate Anesthesia Transfer of Care Note  Patient: Sandra Jensen  Procedure(s) Performed: MRI OF LEFT KNEE WITHOUT CONTRAST (Left Knee)  Patient Location: PACU  Anesthesia Type:General  Level of Consciousness: awake, alert , oriented and patient cooperative  Airway & Oxygen Therapy: Patient Spontanous Breathing and Patient connected to nasal cannula oxygen  Post-op Assessment: Report given to RN, Post -op Vital signs reviewed and stable and Patient moving all extremities X 4  Post vital signs: Reviewed and stable  Last Vitals:  Vitals:   04/19/17 0919 04/19/17 0930  BP: (!) 133/98 (!) 129/95  Pulse: 75 67  Resp: 14 14  Temp: 36.7 C   SpO2: 100% 100%    Last Pain:  Vitals:   04/19/17 0634  TempSrc: Oral  PainSc:       Patients Stated Pain Goal: 2 (04/19/17 16100632)  Complications: No apparent anesthesia complications

## 2017-04-19 NOTE — Anesthesia Postprocedure Evaluation (Signed)
Anesthesia Post Note  Patient: Sandra Jensen  Procedure(s) Performed: MRI OF LEFT KNEE WITHOUT CONTRAST (Left Knee)     Patient location during evaluation: PACU Anesthesia Type: General Level of consciousness: awake and alert Pain management: pain level controlled Vital Signs Assessment: post-procedure vital signs reviewed and stable Respiratory status: spontaneous breathing, nonlabored ventilation, respiratory function stable and patient connected to nasal cannula oxygen Cardiovascular status: blood pressure returned to baseline and stable Postop Assessment: no apparent nausea or vomiting Anesthetic complications: no    Last Vitals:  Vitals:   04/19/17 0930 04/19/17 0945  BP: (!) 129/95   Pulse: 67 65  Resp: 14 18  Temp:  36.7 C  SpO2: 100% 100%    Last Pain:  Vitals:   04/19/17 0945  TempSrc:   PainSc: 0-No pain                 [name]

## 2017-04-19 NOTE — Anesthesia Preprocedure Evaluation (Signed)
Anesthesia Evaluation  Patient identified by MRN, date of birth, ID band Patient awake    Reviewed: Allergy & Precautions, NPO status , Patient's Chart, lab work & pertinent test results  History of Anesthesia Complications (+) PONV  Airway Mallampati: II  TM Distance: >3 FB Neck ROM: Full    Dental   Pulmonary Current Smoker,    Pulmonary exam normal        Cardiovascular Normal cardiovascular exam     Neuro/Psych Anxiety Depression Bipolar Disorder    GI/Hepatic   Endo/Other    Renal/GU Renal disease     Musculoskeletal   Abdominal   Peds  Hematology   Anesthesia Other Findings   Reproductive/Obstetrics                             Anesthesia Physical Anesthesia Plan  ASA: II  Anesthesia Plan: General   Post-op Pain Management:    Induction: Intravenous  PONV Risk Score and Plan: 3 and Ondansetron, Dexamethasone and Midazolam  Airway Management Planned: LMA  Additional Equipment:   Intra-op Plan:   Post-operative Plan: Extubation in OR  Informed Consent: I have reviewed the patients History and Physical, chart, labs and discussed the procedure including the risks, benefits and alternatives for the proposed anesthesia with the patient or authorized representative who has indicated his/her understanding and acceptance.     Plan Discussed with: CRNA and Surgeon  Anesthesia Plan Comments:         Anesthesia Quick Evaluation

## 2017-04-20 ENCOUNTER — Encounter (HOSPITAL_COMMUNITY): Payer: Self-pay | Admitting: Radiology

## 2017-05-07 ENCOUNTER — Ambulatory Visit: Payer: Self-pay | Admitting: Family Medicine

## 2017-05-08 ENCOUNTER — Ambulatory Visit (INDEPENDENT_AMBULATORY_CARE_PROVIDER_SITE_OTHER): Payer: Medicare Other | Admitting: Family Medicine

## 2017-05-08 ENCOUNTER — Encounter: Payer: Self-pay | Admitting: Family Medicine

## 2017-05-08 VITALS — BP 115/75 | HR 80 | Temp 98.8°F | Ht 66.0 in | Wt 190.2 lb

## 2017-05-08 DIAGNOSIS — Z113 Encounter for screening for infections with a predominantly sexual mode of transmission: Secondary | ICD-10-CM

## 2017-05-08 DIAGNOSIS — Z114 Encounter for screening for human immunodeficiency virus [HIV]: Secondary | ICD-10-CM | POA: Diagnosis not present

## 2017-05-08 DIAGNOSIS — Z79899 Other long term (current) drug therapy: Secondary | ICD-10-CM

## 2017-05-08 DIAGNOSIS — G4750 Parasomnia, unspecified: Secondary | ICD-10-CM

## 2017-05-08 DIAGNOSIS — F4310 Post-traumatic stress disorder, unspecified: Secondary | ICD-10-CM

## 2017-05-08 DIAGNOSIS — M25561 Pain in right knee: Secondary | ICD-10-CM

## 2017-05-08 DIAGNOSIS — H5213 Myopia, bilateral: Secondary | ICD-10-CM

## 2017-05-08 DIAGNOSIS — Z1231 Encounter for screening mammogram for malignant neoplasm of breast: Secondary | ICD-10-CM

## 2017-05-08 DIAGNOSIS — J069 Acute upper respiratory infection, unspecified: Secondary | ICD-10-CM | POA: Diagnosis not present

## 2017-05-08 DIAGNOSIS — M25562 Pain in left knee: Secondary | ICD-10-CM

## 2017-05-08 DIAGNOSIS — Z13220 Encounter for screening for lipoid disorders: Secondary | ICD-10-CM

## 2017-05-08 DIAGNOSIS — F3160 Bipolar disorder, current episode mixed, unspecified: Secondary | ICD-10-CM | POA: Diagnosis not present

## 2017-05-08 DIAGNOSIS — R635 Abnormal weight gain: Secondary | ICD-10-CM | POA: Diagnosis not present

## 2017-05-08 DIAGNOSIS — Z1239 Encounter for other screening for malignant neoplasm of breast: Secondary | ICD-10-CM

## 2017-05-08 DIAGNOSIS — G8929 Other chronic pain: Secondary | ICD-10-CM

## 2017-05-08 LAB — UA/M W/RFLX CULTURE, ROUTINE
BILIRUBIN UA: NEGATIVE
GLUCOSE, UA: NEGATIVE
KETONES UA: NEGATIVE
NITRITE UA: NEGATIVE
Protein, UA: NEGATIVE
RBC UA: NEGATIVE
SPEC GRAV UA: 1.015 (ref 1.005–1.030)
Urobilinogen, Ur: 0.2 mg/dL (ref 0.2–1.0)
pH, UA: 6 (ref 5.0–7.5)

## 2017-05-08 LAB — MICROSCOPIC EXAMINATION: RBC, UA: NONE SEEN /hpf (ref 0–?)

## 2017-05-08 MED ORDER — ALBUTEROL SULFATE HFA 108 (90 BASE) MCG/ACT IN AERS: 2.0000 | INHALATION_SPRAY | Freq: Four times a day (QID) | RESPIRATORY_TRACT | 3 refills | Status: DC | PRN

## 2017-05-08 MED ORDER — UMECLIDINIUM-VILANTEROL 62.5-25 MCG/INH IN AEPB: 1.0000 | INHALATION_SPRAY | Freq: Every day | RESPIRATORY_TRACT | 3 refills | Status: DC

## 2017-05-08 MED ORDER — BENZONATATE 200 MG PO CAPS: 200.0000 mg | ORAL_CAPSULE | Freq: Two times a day (BID) | ORAL | 0 refills | Status: DC | PRN
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
## 2017-05-08 NOTE — Assessment & Plan Note (Signed)
Would like to get sleep study. Referral generated today.

## 2017-05-08 NOTE — Assessment & Plan Note (Signed)
Stable. Following with psychiatry. Continue to monitor. Call with any concerns.  

## 2017-05-08 NOTE — Assessment & Plan Note (Signed)
Stable. Following with psychiatry. Continue to monitor. Call with any concerns.

## 2017-05-08 NOTE — Assessment & Plan Note (Signed)
Would like to see the pain clinic. Referral generated today.

## 2017-05-09 ENCOUNTER — Telehealth: Payer: Self-pay | Admitting: Family Medicine

## 2017-05-09 LAB — CBC WITH DIFFERENTIAL/PLATELET
BASOS ABS: 0.1 10*3/uL (ref 0.0–0.2)
Basos: 1 %
EOS (ABSOLUTE): 0.4 10*3/uL (ref 0.0–0.4)
EOS: 4 %
Hematocrit: 35.1 % (ref 34.0–46.6)
Hemoglobin: 11.2 g/dL (ref 11.1–15.9)
IMMATURE GRANULOCYTES: 1 %
Immature Grans (Abs): 0 10*3/uL (ref 0.0–0.1)
LYMPHS ABS: 2.1 10*3/uL (ref 0.7–3.1)
Lymphs: 24 %
MCH: 26.6 pg (ref 26.6–33.0)
MCHC: 31.9 g/dL (ref 31.5–35.7)
MCV: 83 fL (ref 79–97)
MONOS ABS: 0.6 10*3/uL (ref 0.1–0.9)
Monocytes: 7 %
Neutrophils Absolute: 5.7 10*3/uL (ref 1.4–7.0)
Neutrophils: 63 %
PLATELETS: 329 10*3/uL (ref 150–379)
RBC: 4.21 x10E6/uL (ref 3.77–5.28)
RDW: 15.1 % (ref 12.3–15.4)
WBC: 8.8 10*3/uL (ref 3.4–10.8)

## 2017-05-09 LAB — LIPID PANEL W/O CHOL/HDL RATIO
CHOLESTEROL TOTAL: 166 mg/dL (ref 100–199)
HDL: 52 mg/dL (ref >39)
LDL CALC: 88 mg/dL (ref 0–99)
TRIGLYCERIDES: 132 mg/dL (ref 0–149)
VLDL CHOLESTEROL CAL: 26 mg/dL (ref 5–40)

## 2017-05-09 LAB — COMPREHENSIVE METABOLIC PANEL
ALK PHOS: 71 IU/L (ref 39–117)
ALT: 12 IU/L (ref 0–32)
AST: 9 IU/L (ref 0–40)
Albumin/Globulin Ratio: 1.6 (ref 1.2–2.2)
Albumin: 3.9 g/dL (ref 3.5–5.5)
BUN/Creatinine Ratio: 20 (ref 9–23)
BUN: 11 mg/dL (ref 6–24)
Bilirubin Total: 0.3 mg/dL (ref 0.0–1.2)
CALCIUM: 8.7 mg/dL (ref 8.7–10.2)
CO2: 22 mmol/L (ref 20–29)
CREATININE: 0.56 mg/dL — AB (ref 0.57–1.00)
Chloride: 106 mmol/L (ref 96–106)
GFR calc Af Amer: 130 mL/min/{1.73_m2} (ref >59)
GFR, EST NON AFRICAN AMERICAN: 113 mL/min/{1.73_m2} (ref >59)
GLOBULIN, TOTAL: 2.5 g/dL (ref 1.5–4.5)
GLUCOSE: 88 mg/dL (ref 65–99)
Potassium: 4.3 mmol/L (ref 3.5–5.2)
Sodium: 141 mmol/L (ref 134–144)
Total Protein: 6.4 g/dL (ref 6.0–8.5)

## 2017-05-09 LAB — HEPATITIS PANEL, ACUTE
HEP B C IGM: NEGATIVE
Hep A IgM: NEGATIVE
Hep C Virus Ab: <0.1 s/co ratio (ref 0.0–0.9)
Hepatitis B Surface Ag: NEGATIVE

## 2017-05-09 LAB — HIV ANTIBODY (ROUTINE TESTING W REFLEX): HIV Screen 4th Generation wRfx: NONREACTIVE

## 2017-05-09 LAB — RPR: RPR Ser Ql: NONREACTIVE

## 2017-05-09 LAB — HSV(HERPES SIMPLEX VRS) I + II AB-IGG: HSV 2 IGG, TYPE SPEC: 23 {index} — AB (ref 0.00–0.90)

## 2017-05-09 LAB — TSH: TSH: 4.07 u[IU]/mL (ref 0.450–4.500)

## 2017-05-09 NOTE — Telephone Encounter (Signed)
Please let her know that her labs look really good. 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 She may never have an outbreak, but if she does, she should call me and I'll send her through some medicine. Thanks!

## 2017-05-10 NOTE — Telephone Encounter (Signed)
Message relayed to patient. Verbalized understanding and denied questions.   

## 2017-05-15 ENCOUNTER — Telehealth: Payer: Self-pay | Admitting: Family Medicine

## 2017-05-15 LAB — GC/CHLAMYDIA PROBE AMP
CHLAMYDIA, DNA PROBE: NEGATIVE
Neisseria gonorrhoeae by PCR: NEGATIVE

## 2017-05-15 NOTE — Telephone Encounter (Signed)
Patient notified

## 2017-05-15 NOTE — Telephone Encounter (Signed)
She will need to discuss this with her orthopedist.

## 2017-05-15 NOTE — Telephone Encounter (Signed)
Please let her know that her GC/chlamydia was negative. Thanks!

## 2017-05-15 NOTE — Telephone Encounter (Signed)
Copied from CRM [id number]. Topic: General - Other >> May 15, 2017  1:01 PM Windy KalataMichael, Taylor L, NT wrote: Reason for CRM: pt would like Dr. Laural BenesJohnson to send an [facility] Dr[facility] Cristine PolioSwinket and let them know they want to do the full knee replacement and not the partial. Also pt would like a call back asap so they can explain it to the nurse

## 2017-05-22 DIAGNOSIS — G894 Chronic pain syndrome: Secondary | ICD-10-CM | POA: Diagnosis not present

## 2017-05-22 DIAGNOSIS — M545 Low back pain: Secondary | ICD-10-CM | POA: Diagnosis not present

## 2017-05-22 DIAGNOSIS — M25569 Pain in unspecified knee: Secondary | ICD-10-CM | POA: Diagnosis not present

## 2017-05-31 ENCOUNTER — Ambulatory Visit: Payer: Medicare Other | Admitting: Family Medicine

## 2017-06-11 ENCOUNTER — Ambulatory Visit: Payer: Medicare Other | Admitting: Family Medicine

## 2017-06-11 DIAGNOSIS — M25562 Pain in left knee: Secondary | ICD-10-CM | POA: Diagnosis not present

## 2017-06-11 DIAGNOSIS — M1712 Unilateral primary osteoarthritis, left knee: Secondary | ICD-10-CM | POA: Diagnosis not present

## 2017-06-11 DIAGNOSIS — G8929 Other chronic pain: Secondary | ICD-10-CM | POA: Diagnosis not present

## 2017-06-13 ENCOUNTER — Institutional Professional Consult (permissible substitution): Payer: Medicare Other | Admitting: Neurology

## 2017-06-15 ENCOUNTER — Other Ambulatory Visit: Payer: Self-pay | Admitting: Family Medicine

## 2017-06-15 ENCOUNTER — Telehealth: Payer: Self-pay | Admitting: Family Medicine

## 2017-06-15 NOTE — Telephone Encounter (Signed)
Copied from CRM [phone]#[id number]. Topic: Quick Communication - Rx Refill/Question >> Jun 15, 2017  3:24 PM Alexander BergeronBarksdale, Harvey B wrote: Pt called to check status of benzonatate (TESSALON) 200 MG capsule [[id number]][[id number]]  Rx but she will contact the pharmacy to make sure Rx is there

## 2017-06-27 DIAGNOSIS — M25569 Pain in unspecified knee: Secondary | ICD-10-CM | POA: Diagnosis not present

## 2017-06-27 DIAGNOSIS — G894 Chronic pain syndrome: Secondary | ICD-10-CM | POA: Diagnosis not present

## 2017-06-27 DIAGNOSIS — M545 Low back pain: Secondary | ICD-10-CM | POA: Diagnosis not present

## 2017-07-17 ENCOUNTER — Institutional Professional Consult (permissible substitution): Payer: Medicare Other | Admitting: Neurology

## 2017-07-17 ENCOUNTER — Telehealth: Payer: Self-pay

## 2017-07-17 NOTE — Telephone Encounter (Signed)
Pt did not show for their appt with Dr. Frances FurbishAthar today. This is pt's 2nd new patient no show. Pt also no showed on 06/13/17 for a new patient visit.  Will send to Dr. Frances FurbishAthar and Karoline CaldwellAngie for review.

## 2017-07-17 NOTE — Telephone Encounter (Signed)
Please follow dismissal protocol as per our No Show Policy for new pt referrals.

## 2017-07-19 ENCOUNTER — Encounter: Payer: Self-pay | Admitting: Neurology

## 2017-08-07 ENCOUNTER — Telehealth: Payer: Self-pay | Admitting: Family Medicine

## 2017-08-07 NOTE — Telephone Encounter (Signed)
Copied from CRM [id number]. Topic: General - Other >> Aug 07, 2017  4:01 PM Cecelia ByarsGreen, Salvador Bigbee L, RMA wrote: Reason for CRM: patient is requesting a call back concerning appt for sleep study appt

## 2017-08-08 DIAGNOSIS — G894 Chronic pain syndrome: Secondary | ICD-10-CM | POA: Diagnosis not present

## 2017-08-08 DIAGNOSIS — M25569 Pain in unspecified knee: Secondary | ICD-10-CM | POA: Diagnosis not present

## 2017-08-08 DIAGNOSIS — Z79891 Long term (current) use of opiate analgesic: Secondary | ICD-10-CM | POA: Diagnosis not present

## 2017-08-08 DIAGNOSIS — M545 Low back pain: Secondary | ICD-10-CM | POA: Diagnosis not present

## 2017-08-08 NOTE — Telephone Encounter (Signed)
Pt states that the wrong paperwork was filled for her sleep study. It was filled out for a CPAP rx instead of the overnight sleep study. Please contact pt.

## 2017-08-09 NOTE — Telephone Encounter (Signed)
New paperwork filled out

## 2017-08-15 ENCOUNTER — Telehealth: Payer: Self-pay

## 2017-08-15 DIAGNOSIS — M25562 Pain in left knee: Principal | ICD-10-CM

## 2017-08-15 DIAGNOSIS — G8929 Other chronic pain: Secondary | ICD-10-CM

## 2017-08-15 DIAGNOSIS — M25561 Pain in right knee: Principal | ICD-10-CM

## 2017-08-15 NOTE — Telephone Encounter (Signed)
Please enter in new referral.  Other one has been handled and closed.
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
## 2017-08-16 ENCOUNTER — Telehealth: Payer: Self-pay

## 2017-08-16 NOTE — Telephone Encounter (Signed)
Johnson, Megan LuddenP, DO  [place], [place], [place]        We are going to have to tell her the situation and that we cannot go anywhere closer. I can send [facility] if she wants of [place], but it [place]'t look like [place] [facility] [place]   Previous Messages    ----- Message -----  From: Marshall CorkBullock, Keri L, CMA  Sent: 08/16/2017 10:13 AM  To: Dorcas CarrowMegan P Johnson, DO  Subject: Sleep                       [facility] also denied referral like feeling great for patient's insurance. I referred her to [facility] after feeling great but she n/s'd and said she wanted to go somewhere closer.   Send back to [facility]?

## 2017-08-16 NOTE — Telephone Encounter (Signed)
Tried calling patient to explain what Dr. Laural BenesJohnson has said. No answer, LVM for patient to return phone call.  Routing to CMA as FYI.

## 2017-08-17 NOTE — Telephone Encounter (Signed)
Pt called to return the missed phone call of Keri, contact pt when possible to explain Dr. Ian MalkinJohnsons message

## 2017-08-17 NOTE — Telephone Encounter (Signed)
Spoke with patient, she states that she has an appointment on Tuesday for a [facility] study at [facility], please contact the patient to see what is going on.

## 2017-08-21 NOTE — Telephone Encounter (Signed)
Copied from CRM [phone]. Topic: General - Other >> Aug 07, 2017  4:01 PM Cecelia ByarsGreen, Temeka L, RMA wrote: Reason for CRM: patient is requesting a call back concerning appt for sleep study appt   >> Aug 20, 2017  4:44 PM Terisa Starraylor, Brittany L wrote: Patient said that Lyla SonCarrie was suppose to call her today in regards to her sleep study. Please call patient back at [phone]

## 2017-08-21 NOTE — Telephone Encounter (Signed)
I have tried to contact patient.  No answer, went straight to voicemail.  Will try again later.

## 2017-08-23 NOTE — Telephone Encounter (Signed)
Spoke with Patient. She had an appointment scheduled this week but they had to cancel because the technician was not in.  She states she also just had a death in the family, wants the sleep study done but can't right now. Notified patient that she needs to call and explain that to them and she could even schedule an appointment further out in date. Patient is going to [facility]. Patient verbalized understanding.

## 2017-09-05 DIAGNOSIS — M545 Low back pain: Secondary | ICD-10-CM | POA: Diagnosis not present

## 2017-09-05 DIAGNOSIS — Z79891 Long term (current) use of opiate analgesic: Secondary | ICD-10-CM | POA: Diagnosis not present

## 2017-09-05 DIAGNOSIS — M25569 Pain in unspecified knee: Secondary | ICD-10-CM | POA: Diagnosis not present

## 2017-09-05 DIAGNOSIS — G894 Chronic pain syndrome: Secondary | ICD-10-CM | POA: Diagnosis not present

## 2017-09-07 ENCOUNTER — Telehealth: Payer: Self-pay | Admitting: Family Medicine

## 2017-09-07 NOTE — Telephone Encounter (Signed)
Copied from CRM [phone]. Topic: Referral - Request >> Sep 07, 2017  1:21 PM Raquel SarnaHayes, Teresa G wrote: Pt needing a new referral.  The current one is very unprofessional.  Requesting: [facility]  [phone]

## 2017-09-07 NOTE — Telephone Encounter (Signed)
I'm not sure what she's referring to? I don't see that she has seen anyone regarding this, and that they have just contacted her several times and that she has not had an appointment with her.   She also needs a follow up.  Also routing to [place] as FY[place]- seeming to be having quite a few issues with referrals, unclear what is going on.

## 2017-09-07 NOTE — Telephone Encounter (Signed)
I contacted Sandra Jensen regarding her new referral request and she stated that she has been seeing Dr. Tollie EthPlummer at [facility] in [place]. She says that they are "rude and ghetto" and that she no longer wants to go there. Patient also stated she was not aware of the referral for Dr. Cherylann RatelLateef at [facility]. I provided the phone number and advised the patient to call to schedule an appointment. Patient agreed.

## 2017-09-07 NOTE — Telephone Encounter (Signed)
Noted  

## 2017-09-18 ENCOUNTER — Telehealth: Payer: Self-pay | Admitting: Family Medicine

## 2017-09-18 DIAGNOSIS — G8929 Other chronic pain: Secondary | ICD-10-CM

## 2017-09-18 DIAGNOSIS — M25562 Pain in left knee: Principal | ICD-10-CM

## 2017-09-18 DIAGNOSIS — M25561 Pain in right knee: Principal | ICD-10-CM

## 2017-09-18 NOTE — Telephone Encounter (Signed)
New referral generated in Dr. Henriette CombsJohnson's absence.    Copied from CRM [id number]. Topic: Referral - Request >> Sep 17, 2017  2:46 PM Floria RavelingStovall, Shana A wrote: Reason for CRM: pt called in and is needing another referral to [facility] for her [facility].  They told her they only keep referrals for 3 month and after they expire  they have to get new ones   >> Sep 17, 2017  4:26 PM Marshall CorkBullock, Keri L, [place] wrote: Routing to provider to enter in referral

## 2017-09-26 ENCOUNTER — Ambulatory Visit: Payer: Self-pay | Admitting: Nurse Practitioner

## 2017-09-29 ENCOUNTER — Other Ambulatory Visit: Payer: Self-pay

## 2017-09-29 ENCOUNTER — Emergency Department (HOSPITAL_COMMUNITY)
Admission: EM | Admit: 2017-09-29 | Discharge: 2017-09-30 | Disposition: A | Discharge status: Home / Self Care | Payer: Medicare Other | Attending: Emergency Medicine | Admitting: Emergency Medicine

## 2017-09-29 ENCOUNTER — Encounter (HOSPITAL_COMMUNITY): Payer: Self-pay

## 2017-09-29 DIAGNOSIS — F319 Bipolar disorder, unspecified: Secondary | ICD-10-CM | POA: Diagnosis present

## 2017-09-29 DIAGNOSIS — Z87891 Personal history of nicotine dependence: Secondary | ICD-10-CM | POA: Diagnosis not present

## 2017-09-29 DIAGNOSIS — Z9114 Patient's other noncompliance with medication regimen: Secondary | ICD-10-CM | POA: Insufficient documentation

## 2017-09-29 DIAGNOSIS — J449 Chronic obstructive pulmonary disease, unspecified: Secondary | ICD-10-CM | POA: Diagnosis not present

## 2017-09-29 DIAGNOSIS — Z79899 Other long term (current) drug therapy: Secondary | ICD-10-CM | POA: Insufficient documentation

## 2017-09-29 DIAGNOSIS — F1414 Cocaine abuse with cocaine-induced mood disorder: Secondary | ICD-10-CM

## 2017-09-29 DIAGNOSIS — F1410 Cocaine abuse, uncomplicated: Secondary | ICD-10-CM

## 2017-09-29 DIAGNOSIS — R45850 Homicidal ideations: Secondary | ICD-10-CM | POA: Insufficient documentation

## 2017-09-29 DIAGNOSIS — R4586 Emotional lability: Secondary | ICD-10-CM

## 2017-09-29 LAB — CBC
HCT: 37.2 % (ref 36.0–46.0)
HEMOGLOBIN: 12 g/dL (ref 12.0–15.0)
MCH: 26.5 pg (ref 26.0–34.0)
MCHC: 32.3 g/dL (ref 30.0–36.0)
MCV: 82.3 fL (ref 78.0–100.0)
Platelets: 330 10*3/uL (ref 150–400)
RBC: 4.52 MIL/uL (ref 3.87–5.11)
RDW: 14.7 % (ref 11.5–15.5)
WBC: 9.9 10*3/uL (ref 4.0–10.5)

## 2017-09-29 LAB — I-STAT BETA HCG BLOOD, ED (MC, WL, AP ONLY)

## 2017-09-29 NOTE — ED Notes (Signed)
Bed: [facility] Expected date:  Expected time:  Means of arrival:  Comments: 
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
## 2017-09-30 DIAGNOSIS — F319 Bipolar disorder, unspecified: Secondary | ICD-10-CM | POA: Diagnosis not present

## 2017-09-30 DIAGNOSIS — F1414 Cocaine abuse with cocaine-induced mood disorder: Secondary | ICD-10-CM | POA: Diagnosis present

## 2017-09-30 LAB — COMPREHENSIVE METABOLIC PANEL
ALT: 11 U/L — AB (ref 14–54)
ANION GAP: 7 (ref 5–15)
AST: 12 U/L — ABNORMAL LOW (ref 15–41)
Albumin: 3.3 g/dL — ABNORMAL LOW (ref 3.5–5.0)
Alkaline Phosphatase: 41 U/L (ref 38–126)
BUN: 18 mg/dL (ref 6–20)
CHLORIDE: 111 mmol/L (ref 101–111)
CO2: 22 mmol/L (ref 22–32)
Calcium: 8.6 mg/dL — ABNORMAL LOW (ref 8.9–10.3)
Creatinine, Ser: 0.49 mg/dL (ref 0.44–1.00)
Glucose, Bld: 106 mg/dL — ABNORMAL HIGH (ref 65–99)
POTASSIUM: 3.6 mmol/L (ref 3.5–5.1)
Sodium: 140 mmol/L (ref 135–145)
Total Bilirubin: 0.3 mg/dL (ref 0.3–1.2)
Total Protein: 5.9 g/dL — ABNORMAL LOW (ref 6.5–8.1)

## 2017-09-30 LAB — RAPID URINE DRUG SCREEN, HOSP PERFORMED
Amphetamines: NOT DETECTED
Barbiturates: NOT DETECTED
Benzodiazepines: NOT DETECTED
Cocaine: POSITIVE — AB
Opiates: NOT DETECTED
Tetrahydrocannabinol: NOT DETECTED

## 2017-09-30 LAB — SALICYLATE LEVEL

## 2017-09-30 LAB — ACETAMINOPHEN LEVEL

## 2017-09-30 LAB — ETHANOL

## 2017-09-30 LAB — LITHIUM LEVEL

## 2017-09-30 MED ORDER — LITHIUM CARBONATE ER 300 MG PO TBCR
300.0000 mg | EXTENDED_RELEASE_TABLET | Freq: Two times a day (BID) | ORAL | Status: DC
  Administered 2017-09-30: 300 mg via ORAL
  Filled 2017-09-30: qty 1

## 2017-09-30 MED ORDER — LITHIUM CARBONATE ER 300 MG PO TBCR: 300.0000 mg | EXTENDED_RELEASE_TABLET | Freq: Two times a day (BID) | ORAL | 0 refills | Status: DC

## 2017-09-30 MED ORDER — FLUOXETINE HCL 20 MG PO CAPS: 20.0000 mg | ORAL_CAPSULE | Freq: Every day | ORAL | 0 refills | Status: DC

## 2017-09-30 MED ORDER — FLUOXETINE HCL 20 MG PO CAPS
20.0000 mg | ORAL_CAPSULE | Freq: Every day | ORAL | Status: DC
  Administered 2017-09-30: 20 mg via ORAL
  Filled 2017-09-30: qty 1

## 2017-09-30 NOTE — [facility] Suicide Risk Assessment (Signed)
Suicide Risk Assessment  Discharge Assessment   [facility]   Principal Problem: Cocaine abuse with cocaine-induced mood disorder [facility](HCC) Discharge Diagnoses:  Patient Active Problem List   Diagnosis Date Noted  . Cocaine abuse with cocaine-induced mood disorder [facility](HCC) [F14.14] 09/30/2017    Priority: High  . Parasomnia [G47.50] 05/08/2017  . Chronic pain of both knees [M25.561, M25.562, G89.29] 07/25/2016  . Bipolar disorder (HCC) [F31.9] 06/05/2014  . PTSD (post-traumatic stress disorder) [F43.10] 06/05/2014    Total Time spent with patient: 45 minutes  Musculoskeletal: Strength & Muscle Tone: within normal limits Gait & Station: normal Patient leans: N/A  Psychiatric Specialty Exam:   Blood pressure (!) 118/95, pulse 97, temperature 98.6 F (37 C), temperature source Oral, resp. rate 18, height 5' 5.5" (1.664 m), weight 81.6 kg (180 lb), SpO2 98 %.Body mass index is 29.5 kg/m.  General Appearance: Casual  Eye Contact::  Good  Speech:  Clear and Coherent409  Volume:  Normal  Mood:  Euthymic  Affect:  Congruent  Thought Process:  Coherent and Descriptions of Associations: Intact  Orientation:  Full (Time, Place, and Person)  Thought Content:  WDL and Logical  Suicidal Thoughts:  No  Homicidal Thoughts:  No  Memory:  Immediate;   Good Recent;   Good Remote;   Good  Judgement:  Fair  Insight:  Fair  Psychomotor Activity:  Normal  Concentration:  Good  Recall:  Good  Fund of Knowledge:Fair  Language: Good  Akathisia:  No  Handed:  Right  AIMS (if indicated):     Assets:  Leisure Time Physical Health Resilience  Sleep:     Cognition: WNL  ADL's:  Intact   Mental Status Per Nursing Assessment::   On Admission:   47 yo female who presented to the ED after using cocaine and having homicidal ideations.  Today, she is clear and coherent with no suicidal/homicidal ideations, hallucinations, or withdrawal symptoms.  She wants to continue her  care with Dr Mitzi HansenHeadon, Rx provided for her Prozac (lower dose since she has not taken these in 3 weeks) and Lithium 300 mg BID for mood stabilization.  REquests to leave, minimizes her drug use, stable for discharge.  Demographic Factors:  Caucasian  Loss Factors: NA  Historical Factors: NA  Risk Reduction Factors:   Sense of responsibility to family, Living with another person, especially a relative, Positive social support and Positive therapeutic relationship  Continued Clinical Symptoms:  None   Cognitive Features That Contribute To Risk:  None    Suicide Risk:  Minimal: No identifiable suicidal ideation.  Patients presenting with no risk factors but with morbid ruminations; may be classified as minimal risk based on the severity of the depressive symptoms    Plan Of Care/Follow-up recommendations:  Activity:  as tolerated Diet:  heart healthy diet  Cono Gebhard, NP 09/30/2017, 10:03 AM
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
## 2017-09-30 NOTE — ED Notes (Signed)
Lab informed of stat Li+ lvl Sandra Jensen reports she will be up shortly

## 2017-09-30 NOTE — ED Notes (Signed)
SBAR Report received from previous nurse. Pt received calm and visible on unit. Pt denies current SI, A/V H, depression, or pain at this time, and appears otherwise stable and free of distress pt endorses HI anxi. Pt reminded of camera surveillance, q 15 min rounds, and rules of the milieu. Pt offered food and pt signed belongings list.  Will continue to assess.

## 2017-09-30 NOTE — ED Notes (Signed)
Pt denies SI/HI/AVH. Pt given discharge instructions including prescriptions. Pt states understanding. Pt states receipt of all belongings. Patient picked up by sister Armando Reichert(Marian).

## 2017-09-30 NOTE — ED Notes (Signed)
Patient signed consent for writer to speak with her sister Armando Reichert(Marian).  Writer informed sister that patient was discharging.  Sister states she will come pick her up.

## 2017-09-30 NOTE — ED Notes (Signed)
Pt has one white pt belongings' bag placed under the water fountain in triage.

## 2017-11-08 ENCOUNTER — Telehealth: Payer: Self-pay

## 2017-11-08 NOTE — Telephone Encounter (Signed)
Copied from CRM [id number]. Topic: Medicare AWV >> Nov 08, 2017  2:12 PM Roy, [place] A, LPN wrote: Reason for CRM: Called to schedule medicare annual wellness visit with NHA- Tiffany Hill,LPN at [facility]. Please schedule anytime. Any questions please contact Bedelia Person on skype or by phone- [phone]

## 2017-11-30 ENCOUNTER — Telehealth: Payer: Self-pay

## 2017-11-30 NOTE — Telephone Encounter (Signed)
Copied from CRM [id number]. Topic: Medicare AWV >> Nov 30, 2017  2:34 PM Hill, Tiffany A, LPN wrote: Reason for CRM: Called to schedule medicare annual wellness visit with NHA[facility] Tiffany Hill[facility]LP[facility]. Please schedule anytime. Any questions please contact Bedelia PersonKathyrn Brown on skype or by phone- [phone]

## 2017-12-05 ENCOUNTER — Emergency Department (HOSPITAL_COMMUNITY)
Admission: EM | Admit: 2017-12-05 | Discharge: 2017-12-06 | Disposition: A | Discharge status: Home / Self Care | Payer: Medicare Other | Attending: Emergency Medicine | Admitting: Emergency Medicine

## 2017-12-05 ENCOUNTER — Encounter (HOSPITAL_COMMUNITY): Payer: Self-pay | Admitting: Emergency Medicine

## 2017-12-05 DIAGNOSIS — J45909 Unspecified asthma, uncomplicated: Secondary | ICD-10-CM | POA: Diagnosis not present

## 2017-12-05 DIAGNOSIS — F3160 Bipolar disorder, current episode mixed, unspecified: Secondary | ICD-10-CM

## 2017-12-05 DIAGNOSIS — F319 Bipolar disorder, unspecified: Secondary | ICD-10-CM | POA: Diagnosis present

## 2017-12-05 DIAGNOSIS — Z87891 Personal history of nicotine dependence: Secondary | ICD-10-CM | POA: Insufficient documentation

## 2017-12-05 DIAGNOSIS — Z046 Encounter for general psychiatric examination, requested by authority: Secondary | ICD-10-CM | POA: Diagnosis present

## 2017-12-05 DIAGNOSIS — F909 Attention-deficit hyperactivity disorder, unspecified type: Secondary | ICD-10-CM | POA: Diagnosis not present

## 2017-12-05 DIAGNOSIS — F32A Depression, unspecified: Secondary | ICD-10-CM

## 2017-12-05 DIAGNOSIS — F329 Major depressive disorder, single episode, unspecified: Secondary | ICD-10-CM | POA: Insufficient documentation

## 2017-12-05 LAB — CBC
HEMATOCRIT: 41.2 % (ref 36.0–46.0)
Hemoglobin: 13.3 g/dL (ref 12.0–15.0)
MCH: 27 pg (ref 26.0–34.0)
MCHC: 32.3 g/dL (ref 30.0–36.0)
MCV: 83.6 fL (ref 78.0–100.0)
Platelets: 333 10*3/uL (ref 150–400)
RBC: 4.93 MIL/uL (ref 3.87–5.11)
RDW: 14.7 % (ref 11.5–15.5)
WBC: 11 10*3/uL — ABNORMAL HIGH (ref 4.0–10.5)

## 2017-12-05 NOTE — ED Notes (Signed)
Bed: [facility] Expected date:  Expected time:  Means of arrival:  Comments: 

## 2017-12-05 NOTE — ED Triage Notes (Signed)
Patient BIB [facility], reports increased depression since mother died approximately two weeks ago. Denies SI/HI/A/V/H. Reports getting in an argument with her sister and was standing out in the rain. Denies alcohol and drug use. Reports being off psych meds x "a while."
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
## 2017-12-06 ENCOUNTER — Other Ambulatory Visit: Payer: Self-pay

## 2017-12-06 DIAGNOSIS — F3160 Bipolar disorder, current episode mixed, unspecified: Secondary | ICD-10-CM | POA: Diagnosis not present

## 2017-12-06 LAB — RAPID URINE DRUG SCREEN, HOSP PERFORMED
Amphetamines: NOT DETECTED
Barbiturates: NOT DETECTED
Benzodiazepines: NOT DETECTED
Cocaine: NOT DETECTED
Opiates: NOT DETECTED
Tetrahydrocannabinol: NOT DETECTED

## 2017-12-06 LAB — COMPREHENSIVE METABOLIC PANEL
ALT: 14 U/L (ref 14–54)
AST: 12 U/L — AB (ref 15–41)
Albumin: 4 g/dL (ref 3.5–5.0)
Alkaline Phosphatase: 55 U/L (ref 38–126)
Anion gap: 6 (ref 5–15)
BUN: 15 mg/dL (ref 6–20)
CO2: 27 mmol/L (ref 22–32)
CREATININE: 0.47 mg/dL (ref 0.44–1.00)
Calcium: 8.7 mg/dL — ABNORMAL LOW (ref 8.9–10.3)
Chloride: 109 mmol/L (ref 101–111)
GFR calc Af Amer: >60 mL/min (ref >60)
Glucose, Bld: 100 mg/dL — ABNORMAL HIGH (ref 65–99)
Potassium: 3.8 mmol/L (ref 3.5–5.1)
Sodium: 142 mmol/L (ref 135–145)
Total Bilirubin: 0.8 mg/dL (ref 0.3–1.2)
Total Protein: 6.5 g/dL (ref 6.5–8.1)

## 2017-12-06 LAB — I-STAT BETA HCG BLOOD, ED (MC, WL, AP ONLY)

## 2017-12-06 LAB — ETHANOL: Alcohol, Ethyl (B): <10 mg/dL (ref <10)

## 2017-12-06 MED ORDER — UMECLIDINIUM-VILANTEROL 62.5-25 MCG/INH IN AEPB
1.0000 | INHALATION_SPRAY | Freq: Every day | RESPIRATORY_TRACT | Status: DC
  Administered 2017-12-06: 1 via RESPIRATORY_TRACT
  Filled 2017-12-06: qty 14

## 2017-12-06 MED ORDER — ALBUTEROL SULFATE HFA 108 (90 BASE) MCG/ACT IN AERS
2.0000 | INHALATION_SPRAY | Freq: Four times a day (QID) | RESPIRATORY_TRACT | Status: DC | PRN
  Administered 2017-12-06: 2 via RESPIRATORY_TRACT
  Filled 2017-12-06: qty 6.7

## 2017-12-06 NOTE — BH Assessment (Signed)
[facility] Assessment Progress Note   Clinician attempted to awaken patient but she kept sleeping.  TTS to assess when patient is alert and oriented.
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
## 2017-12-06 NOTE — ED Notes (Signed)
[facility] Expected date:  Expected time:  Means of arrival:  Comments: Chai

## 2017-12-06 NOTE — BH Assessment (Signed)
[facility]  Per Juanetta BeetsJacqueline Norman, DO, this pt does not require psychiatric hospitalization at this time.  [place] of [place], and does not require any outpatient referrals, but pt would benefit from seeing Peer Support Specialists; they will be asked to speak to pt.  Pt's nurse, Kendal Hymendie, has been notified.  Doylene Canninghomas Lynlee Stratton, MA Triage Specialist [phone]

## 2017-12-06 NOTE — Discharge Instructions (Signed)
For your behavioral health needs, you are advised to continue treatment with [facility]ings[facility] Head[facility].  They also provide outpatient therapy, and you are advised to ask about receiving therapy services from them at your earliest opportunity:       [facility]      [address]      [place], [place] [address]      [phone]
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
## 2018-01-17 ENCOUNTER — Ambulatory Visit: Payer: Medicare Other | Admitting: Nurse Practitioner

## 2018-02-13 ENCOUNTER — Telehealth: Payer: Self-pay

## 2018-02-13 NOTE — Telephone Encounter (Signed)
Copied from CRM [phone]. Topic: Medicare AWV >> Feb 13, 2018  9:25 AM Collene SchlichterHill, Tiffany A, LPN wrote: Reason for CRM: called to schedule medicare annual wellness visit with nurse health advisor, eligible now.

## 2018-03-10 DIAGNOSIS — F331 Major depressive disorder, recurrent, moderate: Secondary | ICD-10-CM | POA: Insufficient documentation

## 2018-03-11 DIAGNOSIS — E440 Moderate protein-calorie malnutrition: Secondary | ICD-10-CM | POA: Insufficient documentation

## 2018-03-12 DIAGNOSIS — F329 Major depressive disorder, single episode, unspecified: Secondary | ICD-10-CM | POA: Insufficient documentation

## 2018-04-02 ENCOUNTER — Ambulatory Visit (INDEPENDENT_AMBULATORY_CARE_PROVIDER_SITE_OTHER): Payer: Medicare Other | Admitting: Family Medicine

## 2018-04-02 ENCOUNTER — Encounter: Payer: Self-pay | Admitting: Family Medicine

## 2018-04-02 VITALS — BP 119/71 | HR 77 | Wt 188.6 lb

## 2018-04-02 DIAGNOSIS — M25561 Pain in right knee: Secondary | ICD-10-CM | POA: Diagnosis not present

## 2018-04-02 DIAGNOSIS — M25562 Pain in left knee: Secondary | ICD-10-CM

## 2018-04-02 DIAGNOSIS — E440 Moderate protein-calorie malnutrition: Secondary | ICD-10-CM

## 2018-04-02 DIAGNOSIS — Z79899 Other long term (current) drug therapy: Secondary | ICD-10-CM | POA: Diagnosis not present

## 2018-04-02 DIAGNOSIS — G8929 Other chronic pain: Secondary | ICD-10-CM

## 2018-04-02 DIAGNOSIS — Z13220 Encounter for screening for lipoid disorders: Secondary | ICD-10-CM

## 2018-04-02 DIAGNOSIS — F3160 Bipolar disorder, current episode mixed, unspecified: Secondary | ICD-10-CM | POA: Diagnosis not present

## 2018-04-02 LAB — UA/M W/RFLX CULTURE, ROUTINE
Bilirubin, UA: NEGATIVE
GLUCOSE, UA: NEGATIVE
Ketones, UA: NEGATIVE
Leukocytes, UA: NEGATIVE
Nitrite, UA: NEGATIVE
Protein, UA: NEGATIVE
RBC UA: NEGATIVE
Specific Gravity, UA: >1.03 — ABNORMAL HIGH (ref 1.005–1.030)
Urobilinogen, Ur: 0.2 mg/dL (ref 0.2–1.0)
pH, UA: 5 (ref 5.0–7.5)

## 2018-04-02 LAB — BAYER DCA HB A1C WAIVED: HB A1C (BAYER DCA - WAIVED): 4.7 % (ref <7.0)

## 2018-04-02 MED ORDER — UMECLIDINIUM-VILANTEROL 62.5-25 MCG/INH IN AEPB: 1.0000 | INHALATION_SPRAY | Freq: Every day | RESPIRATORY_TRACT | 3 refills | Status: DC

## 2018-04-02 MED ORDER — ALBUTEROL SULFATE HFA 108 (90 BASE) MCG/ACT IN AERS: 2.0000 | INHALATION_SPRAY | Freq: Four times a day (QID) | RESPIRATORY_TRACT | 3 refills | Status: DC | PRN

## 2018-04-02 NOTE — Patient Instructions (Addendum)
[facility] [address] [place], [place] [address] 04/04/18 @ 1:15 p.m.

## 2018-04-02 NOTE — Progress Notes (Signed)
BP 119/71   Pulse 77   Wt 188 lb 9.6 oz (85.5 kg)   SpO2 96%   BMI 30.91 kg/m    Subjective:    Patient ID: Sandra Jensen, female    DOB: 1971-02-18, 47 y.o.   MRN: [id number]  HPI: Sandra Jensen is a 47 y.o. female who presents today after being lost to follow up for 11 months after establishing care.   Chief Complaint  Patient presents with  . Medication Refill   Care everywhere reviewed- has not seen anyone.  Has been to ER at [facility] 2x since last visit- 1x for cocaine abuse and mood disturbance, 1x due to depression- had been off her psych meds x 6 months at that time. This was in June. She notes that she has been off of all her medicine for 6 months. She notes that she thought that she was better. Unfortunately, her mother died during that time and she has not been doing well at all.   She would like a referral back to orthopedics and to see pain management. She is otherwise doing well with no other concerns or complaints at this time.   Relevant past medical, surgical, family and social history reviewed and updated as indicated. Interim medical history since our last visit reviewed. Allergies and medications reviewed and updated.  Review of Systems  Constitutional: Negative.   Respiratory: Negative.   Cardiovascular: Negative.   Musculoskeletal: Positive for arthralgias, back pain and myalgias. Negative for gait problem, joint swelling, neck pain and neck stiffness.  Skin: Negative.   Psychiatric/Behavioral: Positive for dysphoric mood. Negative for agitation, behavioral problems, confusion, decreased concentration, hallucinations, self-injury, sleep disturbance and suicidal ideas. The patient is nervous/anxious. The patient is not hyperactive.     Per HPI unless specifically indicated above     Objective:    BP 119/71   Pulse 77   Wt 188 lb 9.6 oz (85.5 kg)   SpO2 96%   BMI 30.91 kg/m   Wt Readings from Last 3 Encounters:  04/02/18 188 lb 9.6 oz (85.5 kg)    09/29/17 180 lb (81.6 kg)  05/08/17 190 lb 4 oz (86.3 kg)    Physical Exam  Constitutional: She is oriented to person, place, and time. She appears well-developed and well-nourished. No distress.  HENT:  Head: Normocephalic and atraumatic.  Right Ear: Hearing normal.  Left Ear: Hearing normal.  Nose: Nose normal.  Eyes: Conjunctivae and lids are normal. Right eye exhibits no discharge. Left eye exhibits no discharge. No scleral icterus.  Cardiovascular: Normal rate, regular rhythm, normal heart sounds and intact distal pulses. Exam reveals no gallop and no friction rub.  No murmur heard. Pulmonary/Chest: Effort normal and breath sounds normal. No stridor. No respiratory distress. She has no wheezes. She has no rales. She exhibits no tenderness.  Musculoskeletal: Normal range of motion.  Neurological: She is alert and oriented to person, place, and time.  Skin: Skin is warm, dry and intact. Capillary refill takes less than 2 seconds. No rash noted. She is not diaphoretic. No erythema. No pallor.  Psychiatric: She has a normal mood and affect. Her speech is normal and behavior is normal. Judgment and thought content normal. Cognition and memory are normal.  Nursing note and vitals reviewed.   Results for orders placed or performed in visit on 04/02/18  Bayer DCA Hb A1c Waived  Result Value Ref Range   HB A1C (BAYER DCA - WAIVED) 4.7 <7.0 %  CBC with  Differential/Platelet  Result Value Ref Range   WBC 5.4 3.4 - 10.8 x10E3/uL   RBC 4.15 3.77 - 5.28 x10E6/uL   Hemoglobin 11.4 11.1 - 15.9 g/dL   Hematocrit 16.1 09.6 - 46.6 %   MCV 83 79 - 97 fL   MCH 27.5 26.6 - 33.0 pg   MCHC 33.0 31.5 - 35.7 g/dL   RDW 04.5 40.9 - 81.1 %   Platelets 267 150 - 450 x10E3/uL   Neutrophils 66 Not Estab. %   Lymphs 22 Not Estab. %   Monocytes 7 Not Estab. %   Eos 3 Not Estab. %   Basos 1 Not Estab. %   Neutrophils Absolute 3.6 1.4 - 7.0 x10E3/uL   Lymphocytes Absolute 1.2 0.7 - 3.1 x10E3/uL    Monocytes Absolute 0.4 0.1 - 0.9 x10E3/uL   EOS (ABSOLUTE) 0.2 0.0 - 0.4 x10E3/uL   Basophils Absolute 0.0 0.0 - 0.2 x10E3/uL   Immature Granulocytes 1 Not Estab. %   Immature Grans (Abs) 0.0 0.0 - 0.1 x10E3/uL  Comprehensive metabolic panel  Result Value Ref Range   Glucose 82 65 - 99 mg/dL   BUN 16 6 - 24 mg/dL   Creatinine, Ser 9.14 0.57 - 1.00 mg/dL   GFR calc non Af Amer 112 >59 mL/min/1.73   GFR calc Af Amer 129 >59 mL/min/1.73   BUN/Creatinine Ratio 28 (H) 9 - 23   Sodium 141 134 - 144 mmol/L   Potassium 4.2 3.5 - 5.2 mmol/L   Chloride 106 96 - 106 mmol/L   CO2 21 20 - 29 mmol/L   Calcium 9.1 8.7 - 10.2 mg/dL   Total Protein 6.2 6.0 - 8.5 g/dL   Albumin 4.0 3.5 - 5.5 g/dL   Globulin, Total 2.2 1.5 - 4.5 g/dL   Albumin/Globulin Ratio 1.8 1.2 - 2.2   Bilirubin Total 0.3 0.0 - 1.2 mg/dL   Alkaline Phosphatase 62 39 - 117 IU/L   AST 11 0 - 40 IU/L   ALT 11 0 - 32 IU/L  Lipid Panel w/o Chol/HDL Ratio  Result Value Ref Range   Cholesterol, Total 156 100 - 199 mg/dL   Triglycerides 58 0 - 149 mg/dL   HDL 56 >78 mg/dL   VLDL Cholesterol Cal 12 5 - 40 mg/dL   LDL Calculated 88 0 - 99 mg/dL  TSH  Result Value Ref Range   TSH 1.450 0.450 - 4.500 uIU/mL  UA/M w/rflx Culture, Routine  Result Value Ref Range   Specific Gravity, UA >1.030 (H) 1.005 - 1.030   pH, UA 5.0 5.0 - 7.5   Color, UA Orange Yellow   Appearance Ur Cloudy (A) Clear   Leukocytes, UA Negative Negative   Protein, UA Negative Negative/Trace   Glucose, UA Negative Negative   Ketones, UA Negative Negative   RBC, UA Negative Negative   Bilirubin, UA Negative Negative   Urobilinogen, Ur 0.2 0.2 - 1.0 mg/dL   Nitrite, UA Negative Negative      Assessment & Plan:   Problem List Items Addressed This Visit      Other   Bipolar disorder (HCC) - Primary    Not under good control. Has been off medicine for at least 6 months. Information about [facility] given today. Patient states that she will go over there for  treatment ASAP.       Relevant Orders   Bayer DCA Hb A1c Waived (Completed)   CBC with Differential/Platelet (Completed)   Comprehensive metabolic panel (Completed)   TSH (  Completed)   UA/M w/rflx Culture, Routine (Completed)   Chronic pain of both knees    Will get her into see pain management again. Appointment scheduled. Call with any concerns.       Relevant Medications   clonazePAM (KLONOPIN) 1 MG tablet   Other Relevant Orders   Bayer DCA Hb A1c Waived (Completed)   CBC with Differential/Platelet (Completed)   Comprehensive metabolic panel (Completed)   TSH (Completed)   UA/M w/rflx Culture, Routine (Completed)   Ambulatory referral to Orthopedic Surgery   Moderate protein-calorie malnutrition (HCC)    Labs drawn today. Await results.       Relevant Orders   Bayer DCA Hb A1c Waived (Completed)   CBC with Differential/Platelet (Completed)   Comprehensive metabolic panel (Completed)   TSH (Completed)   UA/M w/rflx Culture, Routine (Completed)    Other Visit Diagnoses    Long-term use of high-risk medication       Labs drawn today. Await results.    Relevant Orders   Bayer DCA Hb A1c Waived (Completed)   CBC with Differential/Platelet (Completed)   Comprehensive metabolic panel (Completed)   TSH (Completed)   UA/M w/rflx Culture, Routine (Completed)   Screening for cholesterol level       Labs drawn today. Await results.    Relevant Orders   Bayer DCA Hb A1c Waived (Completed)   CBC with Differential/Platelet (Completed)   Comprehensive metabolic panel (Completed)   Lipid Panel w/o Chol/HDL Ratio (Completed)   TSH (Completed)   UA/M w/rflx Culture, Routine (Completed)       Follow up plan: Return 2- 4 weeks, for physical and wellness.

## 2018-04-03 ENCOUNTER — Encounter: Payer: Self-pay | Admitting: Family Medicine

## 2018-04-03 LAB — COMPREHENSIVE METABOLIC PANEL
A/G RATIO: 1.8 (ref 1.2–2.2)
ALBUMIN: 4 g/dL (ref 3.5–5.5)
ALT: 11 IU/L (ref 0–32)
AST: 11 IU/L (ref 0–40)
Alkaline Phosphatase: 62 IU/L (ref 39–117)
BILIRUBIN TOTAL: 0.3 mg/dL (ref 0.0–1.2)
BUN / CREAT RATIO: 28 — AB (ref 9–23)
BUN: 16 mg/dL (ref 6–24)
CHLORIDE: 106 mmol/L (ref 96–106)
CO2: 21 mmol/L (ref 20–29)
Calcium: 9.1 mg/dL (ref 8.7–10.2)
Creatinine, Ser: 0.57 mg/dL (ref 0.57–1.00)
GFR calc Af Amer: 129 mL/min/{1.73_m2} (ref >59)
GFR calc non Af Amer: 112 mL/min/{1.73_m2} (ref >59)
GLUCOSE: 82 mg/dL (ref 65–99)
Globulin, Total: 2.2 g/dL (ref 1.5–4.5)
POTASSIUM: 4.2 mmol/L (ref 3.5–5.2)
Sodium: 141 mmol/L (ref 134–144)
Total Protein: 6.2 g/dL (ref 6.0–8.5)

## 2018-04-03 LAB — CBC WITH DIFFERENTIAL/PLATELET
BASOS ABS: 0 10*3/uL (ref 0.0–0.2)
Basos: 1 %
EOS (ABSOLUTE): 0.2 10*3/uL (ref 0.0–0.4)
Eos: 3 %
HEMOGLOBIN: 11.4 g/dL (ref 11.1–15.9)
Hematocrit: 34.5 % (ref 34.0–46.6)
IMMATURE GRANS (ABS): 0 10*3/uL (ref 0.0–0.1)
IMMATURE GRANULOCYTES: 1 %
LYMPHS: 22 %
Lymphocytes Absolute: 1.2 10*3/uL (ref 0.7–3.1)
MCH: 27.5 pg (ref 26.6–33.0)
MCHC: 33 g/dL (ref 31.5–35.7)
MCV: 83 fL (ref 79–97)
MONOCYTES: 7 %
Monocytes Absolute: 0.4 10*3/uL (ref 0.1–0.9)
Neutrophils Absolute: 3.6 10*3/uL (ref 1.4–7.0)
Neutrophils: 66 %
Platelets: 267 10*3/uL (ref 150–450)
RBC: 4.15 x10E6/uL (ref 3.77–5.28)
RDW: 13.8 % (ref 12.3–15.4)
WBC: 5.4 10*3/uL (ref 3.4–10.8)

## 2018-04-03 LAB — LIPID PANEL W/O CHOL/HDL RATIO
CHOLESTEROL TOTAL: 156 mg/dL (ref 100–199)
HDL: 56 mg/dL (ref >39)
LDL Calculated: 88 mg/dL (ref 0–99)
Triglycerides: 58 mg/dL (ref 0–149)
VLDL CHOLESTEROL CAL: 12 mg/dL (ref 5–40)

## 2018-04-03 LAB — TSH: TSH: 1.45 u[IU]/mL (ref 0.450–4.500)

## 2018-04-03 NOTE — Assessment & Plan Note (Signed)
Will get her into see pain management again. Appointment scheduled. Call with any concerns.

## 2018-04-03 NOTE — Assessment & Plan Note (Signed)
Labs drawn today. Await results.  

## 2018-04-03 NOTE — Assessment & Plan Note (Signed)
Not under good control. Has been off medicine for at least 6 months. Information about [facility] given today. Patient states that she will go over there for treatment ASAP.

## 2018-04-04 ENCOUNTER — Encounter: Payer: Self-pay | Admitting: Nurse Practitioner

## 2018-04-04 ENCOUNTER — Ambulatory Visit: Payer: Medicare Other | Attending: Nurse Practitioner | Admitting: Nurse Practitioner

## 2018-04-04 ENCOUNTER — Other Ambulatory Visit: Payer: Self-pay

## 2018-04-04 ENCOUNTER — Telehealth: Payer: Self-pay | Admitting: Family Medicine

## 2018-04-04 DIAGNOSIS — Z79891 Long term (current) use of opiate analgesic: Secondary | ICD-10-CM | POA: Diagnosis not present

## 2018-04-04 DIAGNOSIS — G894 Chronic pain syndrome: Secondary | ICD-10-CM | POA: Diagnosis not present

## 2018-04-04 DIAGNOSIS — Z87891 Personal history of nicotine dependence: Secondary | ICD-10-CM | POA: Insufficient documentation

## 2018-04-04 DIAGNOSIS — Z79899 Other long term (current) drug therapy: Secondary | ICD-10-CM

## 2018-04-04 DIAGNOSIS — M25562 Pain in left knee: Secondary | ICD-10-CM

## 2018-04-04 DIAGNOSIS — F4310 Post-traumatic stress disorder, unspecified: Secondary | ICD-10-CM | POA: Diagnosis not present

## 2018-04-04 DIAGNOSIS — J449 Chronic obstructive pulmonary disease, unspecified: Secondary | ICD-10-CM | POA: Diagnosis not present

## 2018-04-04 DIAGNOSIS — E440 Moderate protein-calorie malnutrition: Secondary | ICD-10-CM | POA: Diagnosis not present

## 2018-04-04 DIAGNOSIS — M899 Disorder of bone, unspecified: Secondary | ICD-10-CM

## 2018-04-04 DIAGNOSIS — G8929 Other chronic pain: Secondary | ICD-10-CM

## 2018-04-04 DIAGNOSIS — Z789 Other specified health status: Secondary | ICD-10-CM

## 2018-04-04 DIAGNOSIS — F319 Bipolar disorder, unspecified: Secondary | ICD-10-CM | POA: Insufficient documentation

## 2018-04-04 MED ORDER — FLUCONAZOLE 150 MG PO TABS: 150.0000 mg | ORAL_TABLET | Freq: Once | ORAL | 0 refills | Status: AC

## 2018-04-04 NOTE — Telephone Encounter (Signed)
She never mentioned a yeast infection. Where is it?

## 2018-04-04 NOTE — Telephone Encounter (Signed)
Called and spoke with patient. She stated yeast infection is both vaginally and under her breast. Pt states it has beeen ongoing x 2 - 3 days. It itches, has foul odor, some redness. Denies any pain.

## 2018-04-04 NOTE — Progress Notes (Signed)
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  Side: Left upper extremity  Inspection: No masses, redness, swelling, or asymmetry. No contractures  Inspection: No masses, redness, swelling, or asymmetry. No contractures  Functional ROM: Unrestricted ROM          Functional ROM: Unrestricted ROM          Muscle strength & Tone: Functionally intact  Muscle strength & Tone: Functionally intact  Sensory: Unimpaired  Sensory: Unimpaired  Palpation: No palpable anomalies              Palpation: No palpable anomalies              Specialized Test(s): Deferred         Specialized Test(s): Deferred          Thoracic Spine Exam  Inspection: No masses, redness, or swelling Alignment: Symmetrical Functional ROM: Unrestricted ROM Stability: No instability detected Sensory: Unimpaired Muscle strength & Tone: No palpable anomalies  Lumbar Spine Exam  Inspection: No masses, redness, or swelling Alignment: Symmetrical Functional ROM: Unrestricted ROM       Stability: No instability detected Muscle strength & Tone: Functionally intact Sensory: Unimpaired Palpation: No palpable anomalies       Provocative Tests: Lumbar Hyperextension and rotation test: evaluation deferred today       Patrick's Maneuver: evaluation deferred today                    Gait & Posture Assessment  Ambulation: Patient ambulates using a cane Gait: Antalgic Posture: Antalgic   Lower Extremity Exam    Side: Right lower extremity  Side: Left lower extremity  Inspection: No masses, redness, swelling, or asymmetry. No contractures  Inspection: Edema of knee and ankle  Functional ROM: Unrestricted ROM  Functional ROM: Pain restricted ROM          Muscle strength & Tone: Functionally intact  Muscle strength & Tone: Functionally intact  Sensory: Unimpaired  Sensory: Unimpaired  Palpation: No palpable anomalies  Palpation: Complains of area being tender to palpation   Assessment  Primary Diagnosis & Pertinent Problem List: Diagnoses of Chronic pain of left knee (Primary Area of Pain), Chronic pain syndrome, Long term current use of opiate analgesic, Pharmacologic therapy, Disorder of skeletal system, and Problems influencing health status were pertinent to this visit.  Visit Diagnosis: 1. Chronic pain of left knee (Primary Area of Pain)   2. Chronic pain syndrome   3. Long term current use of opiate analgesic   4. Pharmacologic therapy   5. Disorder of skeletal system   6. Problems influencing health status    Plan of Care  Initial treatment plan:  Please be advised that as per protocol, today's visit has been an evaluation only. We have not taken over the patient's controlled substance management.  Problem-specific plan: No problem-specific Assessment & Plan notes found for this encounter.  Ordered Lab-work, Procedure(s), Referral(s), & Consult(s): Orders Placed This Encounter  Procedures  . Compliance Drug Analysis, Ur  . Comp. Metabolic Panel (12)   . Magnesium  . Vitamin B12  . Sedimentation rate  . 25-Hydroxyvitamin D Lcms D2+D3  . C-reactive protein   Pharmacotherapy: Medications ordered:  No orders of the defined types were placed in this encounter.  Medications administered during this visit: Omara L. Emmitt had no medications administered during this visit.   Pharmacotherapy under consideration:  Opioid Analgesics: The patient was informed that there is no guarantee that she would be a candidate for opioid analgesics. The decision will be made following CDC guidelines. This decision will be based on the results of diagnostic studies, as well as Ms. Pugh's risk profile.  Membrane stabilizer: To be determined at a later time Muscle relaxant: To be determined at a later time NSAID: To be determined at a later time Other analgesic(s): To be determined at a later time   Interventional therapies under consideration: Ms. Narvaiz was informed that there is no guarantee that she would be a candidate for interventional therapies. The decision will be based on the results of diagnostic studies, as well as Ms. Weseman's risk profile.  Possible procedure(s): Not at this time.    Provider-requested follow-up: Return for 2nd Visit, w/ Dr. Dossie Arbour, Medication Management.  Future Appointments  Date Time Provider [place]  04/22/2018  9:30 AM Milinda Pointer, MD [facility] None  05/07/2018 10:00 AM Valerie Roys, DO [facility]    Primary Care Physician: Valerie Roys, DO Location: [facility] Out[facility] Facility Note by:  Date: 04/04/2018; Time: 5:09 PM  Pain Score Disclaimer: We use the NRS-11 scale. This is a self-reported, subjective measurement of pain severity with only modest accuracy. It is used primarily to identify changes within a particular patient. It must be understood that outpatient pain scales are significantly less accurate that those used for research, where they can be applied under  ideal controlled circumstances with minimal exposure to variables. In reality, the score is likely to be a combination of pain intensity and pain affect, where pain affect describes the degree of emotional arousal or changes in action readiness caused by the sensory experience of pain. Factors such as social and work situation, setting, emotional state, anxiety levels, expectation, and prior pain experience may influence pain perception and show large  inter-individual differences that may also be affected by time variables.  Patient instructions provided during this appointment: Patient Instructions   ____________________________________________________________________________________________  Appointment Policy Summary  It is our goal and responsibility to provide the medical community with assistance in the evaluation and management of patients with chronic pain. Unfortunately our resources are limited. Because we do not have an unlimited amount of time, or available appointments, we are required to closely monitor and manage their use. The following rules exist to maximize their use:  Patient's responsibilities: 1. Punctuality:  At what time should I arrive? You should be physically present in our office 30 minutes before your scheduled appointment. Your scheduled appointment is with your assigned healthcare provider. However, it takes 5-10 minutes to be "checked-in", and another 15 minutes for the nurses to do the admission. If you arrive to our office at the time you were given for your appointment, you will end up being at least 20-25 minutes late to your appointment with the provider. 2. Tardiness:  What happens if I arrive only a few minutes after my scheduled appointment time? You will need to reschedule your appointment. The cutoff is your appointment time. This is why it is so important that you arrive at least 30 minutes before that appointment. If you have an appointment scheduled for  10:00 AM and you arrive at 10:01, you will be required to reschedule your appointment.  3. Plan ahead:  Always assume that you will encounter traffic on your way in. Plan for it. If you are dependent on a driver, make sure they understand these rules and the need to arrive early. 4. Other appointments and responsibilities:  Avoid scheduling any other appointments before or after your pain clinic appointments.  5. Be prepared:  Write down everything that you need to discuss with your healthcare provider and give this information to the admitting nurse. Write down the medications that you will need refilled. Bring your pills and bottles (even the empty ones), to all of your appointments, except for those where a procedure is scheduled. 6. No children or pets:  Find someone to take care of them. It is not appropriate to bring them in. 7. Scheduling changes:  We request "advanced notification" of any changes or cancellations. 8. Advanced notification:  Defined as a time period of more than 24 hours prior to the originally scheduled appointment. This allows for the appointment to be offered to other patients. 9. Rescheduling:  When a visit is rescheduled, it will require the cancellation of the original appointment. For this reason they both fall within the category of "Cancellations".  10. Cancellations:  They require advanced notification. Any cancellation less than 24 hours before the  appointment will be recorded as a "No Show". 11. No Show:  Defined as an unkept appointment where the patient failed to notify or declare to the practice their intention or inability to keep the appointment.  Corrective process for repeat offenders:  1. Tardiness: Three (3) episodes of rescheduling due to late arrivals will be recorded as one (1) "No Show". 2. Cancellation or reschedule: Three (3) cancellations or rescheduling will be recorded as one (1) "No Show". 3. "No Shows": Three (3) "No Shows" within a 12  month period will result in discharge from the practice. ____________________________________________________________________________________________  ____________________________________________________________________________________________  Pain Scale  Introduction: The pain score used by this practice is the Verbal Numerical Rating Scale (VNRS-11). This is an 11-point scale. It is for adults and children 10 years or older. There  are significant differences in how the pain score is reported, used, and applied. Forget everything you learned in the past and learn this scoring system.  General Information: The scale should reflect your current level of pain. Unless you are specifically asked for the level of your worst pain, or your average pain. If you are asked for one of these two, then it should be understood that it is over the past 24 hours.  Basic Activities of Daily Living (ADL): Personal hygiene, dressing, eating, transferring, and using restroom.  Instructions: Most patients tend to report their level of pain as a combination of two factors, their physical pain and their psychosocial pain. This last one is also known as "suffering" and it is reflection of how physical pain affects you socially and psychologically. From now on, report them separately. From this point on, when asked to report your pain level, report only your physical pain. Use the following table for reference.  Pain Clinic Pain Levels (0-5/10)  Pain Level Score  Description  No Pain 0   Mild pain 1 Nagging, annoying, but does not interfere with basic activities of daily living (ADL). Patients are able to eat, bathe, get dressed, toileting (being able to get on and off the toilet and perform personal hygiene functions), transfer (move in and out of bed or a chair without assistance), and maintain continence (able to control bladder and bowel functions). Blood pressure and heart rate are unaffected. A normal heart rate for  a healthy adult ranges from 60 to 100 bpm (beats per minute).   Mild to moderate pain 2 Noticeable and distracting. Impossible to hide from other people. More frequent flare-ups. Still possible to adapt and function close to normal. It can be very annoying and may have occasional stronger flare-ups. With discipline, patients may get used to it and adapt.   Moderate pain 3 Interferes significantly with activities of daily living (ADL). It becomes difficult to feed, bathe, get dressed, get on and off the toilet or to perform personal hygiene functions. Difficult to get in and out of bed or a chair without assistance. Very distracting. With effort, it can be ignored when deeply involved in activities.   Moderately severe pain 4 Impossible to ignore for more than a few minutes. With effort, patients may still be able to manage work or participate in some social activities. Very difficult to concentrate. Signs of autonomic nervous system discharge are evident: dilated pupils (mydriasis); mild sweating (diaphoresis); sleep interference. Heart rate becomes elevated (>115 bpm). Diastolic blood pressure (lower number) rises above 100 mmHg. Patients find relief in laying down and not moving.   Severe pain 5 Intense and extremely unpleasant. Associated with frowning face and frequent crying. Pain overwhelms the senses.  Ability to do any activity or maintain social relationships becomes significantly limited. Conversation becomes difficult. Pacing back and forth is common, as getting into a comfortable position is nearly impossible. Pain wakes you up from deep sleep. Physical signs will be obvious: pupillary dilation; increased sweating; goosebumps; brisk reflexes; cold, clammy hands and feet; nausea, vomiting or dry heaves; loss of appetite; significant sleep disturbance with inability to fall asleep or to remain asleep. When persistent, significant weight loss is observed due to the complete loss of appetite and  sleep deprivation.  Blood pressure and heart rate becomes significantly elevated. Caution: If elevated blood pressure triggers a pounding headache associated with blurred vision, then the patient should immediately seek attention at an urgent or emergency care unit, as  these may be signs of an impending stroke.    Emergency Department Pain Levels (6-10/10)  Emergency Room Pain 6 Severely limiting. Requires emergency care and should not be seen or managed at an outpatient pain management facility. Communication becomes difficult and requires great effort. Assistance to reach the emergency department may be required. Facial flushing and profuse sweating along with potentially dangerous increases in heart rate and blood pressure will be evident.   Distressing pain 7 Self-care is very difficult. Assistance is required to transport, or use restroom. Assistance to reach the emergency department will be required. Tasks requiring coordination, such as bathing and getting dressed become very difficult.   Disabling pain 8 Self-care is no longer possible. At this level, pain is disabling. The individual is unable to do even the most "basic" activities such as walking, eating, bathing, dressing, transferring to a bed, or toileting. Fine motor skills are lost. It is difficult to think clearly.   Incapacitating pain 9 Pain becomes incapacitating. Thought processing is no longer possible. Difficult to remember your own name. Control of movement and coordination are lost.   The worst pain imaginable 10 At this level, most patients pass out from pain. When this level is reached, collapse of the autonomic nervous system occurs, leading to a sudden drop in blood pressure and heart rate. This in turn results in a temporary and dramatic drop in blood flow to the brain, leading to a loss of consciousness. Fainting is one of the body's self defense mechanisms. Passing out puts the brain in a calmed state and causes it to shut  down for a while, in order to begin the healing process.    Summary: 1. Refer to this scale when providing [place] with your pain level. 2. Be accurate and careful when reporting your pain level. This will help with your care. 3. Over-reporting your pain level will lead to loss of credibility. 4. Even a level of 1/10 means that there is pain and will be treated at our facility. 5. High, inaccurate reporting will be documented as "Symptom Exaggeration", leading to loss of credibility and suspicions of possible secondary gains such as obtaining more narcotics, or wanting to appear disabled, for fraudulent reasons. 6. Only pain levels of 5 or below will be seen at our facility. 7. Pain levels of 6 and above will be sent to the Emergency Department and the appointment cancelled. ____________________________________________________________________________________________

## 2018-04-04 NOTE — Telephone Encounter (Signed)
Copied from CRM [id number]. Topic: Quick Communication - See Telephone Encounter >> Apr 04, 2018  9:18 AM Gean Birchwood R wrote: Patient is calling in stating she asked Dr Laural Benes yesterday for yeast infection meds and she forgot to send them over. She is asking them to be sent to  [facility] [place], [place] - [address] [phone]) [phone] (Fax)

## 2018-04-04 NOTE — Progress Notes (Signed)
Safety precautions to be maintained throughout the outpatient stay will include: orient to surroundings, keep bed in low position, maintain call bell within reach at all times, provide assistance with transfer out of bed and ambulation.  

## 2018-04-04 NOTE — Patient Instructions (Signed)

## 2018-04-09 LAB — VITAMIN B12: Vitamin B-12: 211 pg/mL — ABNORMAL LOW (ref 232–1245)

## 2018-04-09 LAB — COMP. METABOLIC PANEL (12)
ALBUMIN: 4 g/dL (ref 3.5–5.5)
AST: 8 IU/L (ref 0–40)
Albumin/Globulin Ratio: 1.7 (ref 1.2–2.2)
Alkaline Phosphatase: 58 IU/L (ref 39–117)
BILIRUBIN TOTAL: 0.2 mg/dL (ref 0.0–1.2)
BUN / CREAT RATIO: 26 — AB (ref 9–23)
BUN: 15 mg/dL (ref 6–24)
CALCIUM: 9.2 mg/dL (ref 8.7–10.2)
CREATININE: 0.57 mg/dL (ref 0.57–1.00)
Chloride: 105 mmol/L (ref 96–106)
GFR calc non Af Amer: 112 mL/min/{1.73_m2} (ref >59)
GFR, EST AFRICAN AMERICAN: 129 mL/min/{1.73_m2} (ref >59)
Globulin, Total: 2.3 g/dL (ref 1.5–4.5)
Glucose: 83 mg/dL (ref 65–99)
Potassium: 4.8 mmol/L (ref 3.5–5.2)
SODIUM: 143 mmol/L (ref 134–144)
TOTAL PROTEIN: 6.3 g/dL (ref 6.0–8.5)

## 2018-04-09 LAB — 25-HYDROXYVITAMIN D LCMS D2+D3
25-HYDROXY, VITAMIN D-3: 33 ng/mL
25-HYDROXY, VITAMIN D: 34 ng/mL

## 2018-04-09 LAB — C-REACTIVE PROTEIN: CRP: 4 mg/L (ref 0–10)

## 2018-04-09 LAB — SEDIMENTATION RATE: Sed Rate: 3 mm/hr (ref 0–32)

## 2018-04-09 LAB — MAGNESIUM: MAGNESIUM: 1.8 mg/dL (ref 1.6–2.3)

## 2018-04-10 LAB — COMPLIANCE DRUG ANALYSIS, UR

## 2018-04-18 NOTE — Progress Notes (Signed)
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  Action 1 (Across chest): deferred Action 2 (Overhead): deferred Action 3 (LB reach): deferred   Provocative Test(s):  Phalen's test: deferred Tinel's test: deferred Apley's scratch test (touch opposite shoulder):  Action 1 (Across chest): deferred Action 2 (Overhead): deferred Action 3 (LB reach): deferred    Thoracic Spine Area Exam  Skin & Axial Inspection: No masses, redness, or swelling Alignment: Symmetrical Functional ROM: Unrestricted ROM Stability: No instability detected Muscle Tone/Strength: Functionally intact. No obvious neuro-muscular anomalies detected. Sensory (Neurological): Unimpaired Muscle strength & Tone: No palpable anomalies  Lumbar Spine Area Exam  Skin & Axial Inspection: No masses, redness, or swelling Alignment: Symmetrical Functional ROM: Unrestricted ROM        Stability: No instability detected Muscle Tone/Strength: Functionally intact. No obvious neuro-muscular anomalies detected. Sensory (Neurological): Unimpaired Palpation: No palpable anomalies       Provocative Tests: Hyperextension/rotation test: deferred today       Lumbar quadrant test (Kemp's test): deferred today       Lateral bending test: deferred today       Patrick's Maneuver: deferred today                   FABER test: deferred today                   S-I anterior distraction/compression test: deferred today         S-I lateral compression test: deferred today         S-I Thigh-thrust test: deferred today         S-I Gaenslen's test: deferred today          Gait & Posture Assessment  Ambulation: Unassisted Gait: Relatively normal for age and body habitus Posture: WNL   Lower Extremity Exam    Side: Right lower extremity  Side: Left lower extremity  Stability: No instability observed          Stability: No instability observed          Skin & Extremity Inspection: Skin color, temperature, and hair growth are WNL. No peripheral edema or cyanosis. No masses, redness, swelling, asymmetry, or associated skin lesions. No contractures.  Skin & Extremity Inspection: Skin color, temperature, and hair growth are WNL. No peripheral edema or cyanosis. No masses, redness, swelling, asymmetry, or associated skin lesions. No contractures.  Functional ROM: Unrestricted ROM  Functional ROM: Unrestricted ROM                  Muscle Tone/Strength: Functionally intact. No obvious neuro-muscular anomalies detected.  Muscle Tone/Strength: Functionally intact. No obvious neuro-muscular anomalies detected.  Sensory (Neurological): Unimpaired  Sensory (Neurological): Unimpaired  Palpation: No palpable anomalies  Palpation: No palpable anomalies   Assessment & Plan  Primary Diagnosis & Pertinent Problem List: The primary encounter diagnosis was Chronic pain syndrome. Diagnoses  of Chronic knee pain (Primary Area of Pain) (Left), Chronic knee pain (Bilateral) (L>R), Arthropathy of knee (Bilateral) (L>R), Anterior cruciate ligament disruption, sequela (Left), Deficiency of posterior cruciate ligament of knee (Left), Degenerative tear of posterior horn of medial meniscus (Left), Osteoarthritis of knee (Left), Chondromalacia, patella (Right), Pharmacologic therapy, Disorder of skeletal system, Problems influencing health status, Long term current use of opiate analgesic, Cocaine abuse with cocaine-induced mood disorder ([place]), and Vitamin B12 deficiency were also pertinent to this visit.  Visit Diagnosis: 1. Chronic pain syndrome   2. Chronic knee pain (Primary Area of Pain) (Left)   3. Chronic knee pain (Bilateral) (L>R)   4. Arthropathy of knee (Bilateral) (L>R)   5. Anterior cruciate ligament disruption, sequela (Left)   6. 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 Degenerative tear of posterior horn of medial meniscus (Left)   8. Osteoarthritis of knee (Left)   9. Chondromalacia, patella (Right)   10. Pharmacologic therapy   11. Disorder of skeletal system   12. Problems influencing health status   13. Long term current use of opiate analgesic   14. Cocaine abuse with cocaine-induced mood disorder ([place])   15. Vitamin B12 deficiency    Problems updated and reviewed during this visit: Problem  Chondromalacia, patella (Right)  Osteoarthritis of knee (Left)  Arthropathy of knee (Bilateral) (L>R)  Degenerative tear of posterior horn of medial meniscus (Left)  Anterior cruciate ligament disruption, sequela (Left)   Mucoid degeneration ACL and PCL (left knee)   Deficiency of posterior cruciate ligament of knee (Left)  Chronic knee pain (Primary Area of Pain) (Left)  Chronic Pain Syndrome  Chronic knee pain (Bilateral) (L>R)  Vitamin B12 Deficiency  Long Term Current Use of Opiate Analgesic  Pharmacologic Therapy  Disorder of Skeletal System   Problems Influencing Health Status  Cocaine Abuse With Cocaine-Induced Mood Disorder ([facility])  Parasomnia  Major Depressive Disorder  Moderate Protein-Calorie Malnutrition ([facility])  Depression, Major, Recurrent, Moderate ([facility])  Bipolar Disorder ([facility])  Ptsd (Post-Traumatic Stress Disorder)   Plan of Care  Pharmacotherapy (Medications Ordered): Meds ordered this encounter  Medications  . Cyanocobalamin (VITAMIN B-12) 5000 MCG SUBL    Sig: Place 1 tablet (5,000 mcg total) under the tongue daily.    Dispense:  30 each    Refill:  0    Do not place medication on "Automatic Refill". Fill one day early if pharmacy is closed on scheduled refill date.   Procedure Orders    No procedure(s) ordered today   Lab Orders  No laboratory test(s) ordered today   Imaging Orders  No imaging studies ordered today   Referral Orders  No referral(s) requested today   Recommendation: The patient does seem to have pathology for which the use of low-dose opioid analgesics may be of some benefit until she can undergo corrective surgery, which is what I would recommend.  Pharmacological management options:  Opioid Analgesics: I will not be prescribing any opioids at this time Membrane stabilizer: None prescribed at this time Muscle  relaxant: None prescribed at this time NSAID: None prescribed at this time Other analgesic(s): None prescribed at this time   Interventional management options: Planned, scheduled, and/or pending:    The patient and the patient's sister have indicated that she is not interested in interventional therapies.   Considering:   Diagnostic bilateral intra-articular knee joint injection with local anesthetic and steroid  Possible series of 5 bilateral intra-articular Hyalgan knee injections  Diagnostic bilateral genicular nerve block  Possible bilateral genicular nerve RFA    PRN Procedures:   None at this time   Provider-requested follow-up: Return if symptoms worsen or  fail to improve.  Future Appointments  Date Time Provider [place]  05/07/2018 10:00 AM Valerie Roys, DO [facility]    Primary Care Physician: Valerie Roys, DO Location: [facility] Note by: Gaspar Cola, MD Date: 04/22/2018; Time: 10:41 AM

## 2018-04-22 ENCOUNTER — Ambulatory Visit: Payer: Medicare Other | Attending: Pain Medicine | Admitting: Pain Medicine

## 2018-04-22 ENCOUNTER — Encounter: Payer: Self-pay | Admitting: Pain Medicine

## 2018-04-22 ENCOUNTER — Telehealth: Payer: Self-pay | Admitting: Family Medicine

## 2018-04-22 ENCOUNTER — Other Ambulatory Visit: Payer: Self-pay

## 2018-04-22 VITALS — BP 116/81 | HR 76 | Temp 97.8°F | Resp 18 | Ht 65.0 in | Wt 190.0 lb

## 2018-04-22 DIAGNOSIS — M25561 Pain in right knee: Secondary | ICD-10-CM

## 2018-04-22 DIAGNOSIS — M170 Bilateral primary osteoarthritis of knee: Secondary | ICD-10-CM | POA: Diagnosis not present

## 2018-04-22 DIAGNOSIS — M238X2 Other internal derangements of left knee: Secondary | ICD-10-CM | POA: Insufficient documentation

## 2018-04-22 DIAGNOSIS — M899 Disorder of bone, unspecified: Secondary | ICD-10-CM

## 2018-04-22 DIAGNOSIS — M25562 Pain in left knee: Secondary | ICD-10-CM

## 2018-04-22 DIAGNOSIS — M23322 Other meniscus derangements, posterior horn of medial meniscus, left knee: Secondary | ICD-10-CM

## 2018-04-22 DIAGNOSIS — S83512S Sprain of anterior cruciate ligament of left knee, sequela: Secondary | ICD-10-CM

## 2018-04-22 DIAGNOSIS — G894 Chronic pain syndrome: Secondary | ICD-10-CM | POA: Diagnosis not present

## 2018-04-22 DIAGNOSIS — Z79899 Other long term (current) drug therapy: Secondary | ICD-10-CM

## 2018-04-22 DIAGNOSIS — G8929 Other chronic pain: Secondary | ICD-10-CM

## 2018-04-22 DIAGNOSIS — F1414 Cocaine abuse with cocaine-induced mood disorder: Secondary | ICD-10-CM

## 2018-04-22 DIAGNOSIS — Z79891 Long term (current) use of opiate analgesic: Secondary | ICD-10-CM

## 2018-04-22 DIAGNOSIS — M1712 Unilateral primary osteoarthritis, left knee: Secondary | ICD-10-CM

## 2018-04-22 DIAGNOSIS — E538 Deficiency of other specified B group vitamins: Secondary | ICD-10-CM

## 2018-04-22 DIAGNOSIS — Z789 Other specified health status: Secondary | ICD-10-CM

## 2018-04-22 DIAGNOSIS — M2241 Chondromalacia patellae, right knee: Secondary | ICD-10-CM

## 2018-04-22 MED ORDER — VITAMIN B-12 5000 MCG SL SUBL: 5000.0000 ug | SUBLINGUAL_TABLET | Freq: Every day | SUBLINGUAL | 0 refills | Status: AC

## 2018-04-22 NOTE — Telephone Encounter (Signed)
Pt saw pain management today. Per there OV notes,   "Pharmacological management options:  Opioid Analgesics: I will not be prescribing any opioids at this time Membrane stabilizer: None prescribed at this time Muscle relaxant: None prescribed at this time NSAID: None prescribed at this time Other analgesic(s): None prescribed at this time   Interventional management options: Planned, scheduled, and/or pending:    The patient and the patient's sister have indicated that she is not interested in interventional therapies.  "

## 2018-04-22 NOTE — Progress Notes (Signed)
Safety precautions to be maintained throughout the outpatient stay will include: orient to surroundings, keep bed in low position, maintain call bell within reach at all times, provide assistance with transfer out of bed and ambulation.  

## 2018-04-22 NOTE — Telephone Encounter (Signed)
Copied from CRM [id number]. Topic: Referral - Request for Referral >> Apr 22, 2018 10:33 AM Darletta Moll L wrote: Has patient seen PCP for this complaint? Yes.  Was referred by Laural Benes recently but the injection treatment made it worse.  *If NO, is insurance requiring patient see PCP for this issue before PCP can refer them? Referral for which specialty: Pain Management Preferred provider/office: 1st pick-->[facility] Dr. Roselie Awkward [phone] OR [facility] Dr. Rayna Sexton P: [phone] Reason for referral: left knee pain, in a lot of pain

## 2018-04-22 NOTE — Telephone Encounter (Signed)
Patient sees pain management. She can follow up with pain management, call her orthopedist or come in for a follow up appointment.

## 2018-04-22 NOTE — Telephone Encounter (Signed)
Called and left a detailed massage for patient letting her know that she needs to come  for an appt to get a new referral or go see her ortho doctor.

## 2018-04-22 NOTE — Patient Instructions (Signed)

## 2018-05-07 ENCOUNTER — Encounter: Payer: Medicare Other | Admitting: Family Medicine

## 2018-05-17 ENCOUNTER — Telehealth: Payer: Self-pay | Admitting: Family Medicine

## 2018-05-17 NOTE — Telephone Encounter (Signed)
Patient notified

## 2018-05-17 NOTE — Telephone Encounter (Signed)
Copied from CRM [id number]. Topic: General - Other >> May 17, 2018 10:28 AM Percival SpanishKennedy, Sandra W wrote:  Pt call to say she has been homeless and has found a place to stay and she  need a letter stating that pt is currently taking  FLUoxetine (PROZAC) 20 MG capsule and that Dr Laural BenesJohnson is the one that RX the medication and that she is stable.  Request to have this letter fax over today so that they can move in tomorrow 05/18/18   Please call when this has been done [phone] pt sister Sandra Jensen   Attn Ms Byrd HesselbachMaria at [facility]   Fax number [phone]

## 2018-05-17 NOTE — Telephone Encounter (Signed)
I am not her psychiatrist and cannot write that letter. I referred her to [facility] last visit, they will have to write that letter.

## 2018-05-31 ENCOUNTER — Encounter: Payer: Medicare Other | Admitting: Family Medicine

## 2018-06-07 ENCOUNTER — Other Ambulatory Visit: Payer: Self-pay

## 2018-06-07 ENCOUNTER — Ambulatory Visit (INDEPENDENT_AMBULATORY_CARE_PROVIDER_SITE_OTHER): Payer: Medicare Other | Admitting: Family Medicine

## 2018-06-07 ENCOUNTER — Other Ambulatory Visit (HOSPITAL_COMMUNITY)
Admission: RE | Admit: 2018-06-07 | Discharge: 2018-06-07 | Disposition: A | Discharge status: Home / Self Care | Payer: Medicare Other | Source: Ambulatory Visit | Attending: Family Medicine | Admitting: Family Medicine

## 2018-06-07 ENCOUNTER — Encounter: Payer: Self-pay | Admitting: Family Medicine

## 2018-06-07 VITALS — BP 105/72 | HR 86 | Temp 98.3°F | Ht 65.0 in | Wt 194.0 lb

## 2018-06-07 DIAGNOSIS — Z0000 Encounter for general adult medical examination without abnormal findings: Secondary | ICD-10-CM

## 2018-06-07 DIAGNOSIS — M25561 Pain in right knee: Secondary | ICD-10-CM | POA: Diagnosis not present

## 2018-06-07 DIAGNOSIS — Z113 Encounter for screening for infections with a predominantly sexual mode of transmission: Secondary | ICD-10-CM

## 2018-06-07 DIAGNOSIS — Z124 Encounter for screening for malignant neoplasm of cervix: Secondary | ICD-10-CM | POA: Diagnosis not present

## 2018-06-07 DIAGNOSIS — E538 Deficiency of other specified B group vitamins: Secondary | ICD-10-CM | POA: Diagnosis not present

## 2018-06-07 DIAGNOSIS — G8929 Other chronic pain: Secondary | ICD-10-CM

## 2018-06-07 DIAGNOSIS — F3160 Bipolar disorder, current episode mixed, unspecified: Secondary | ICD-10-CM

## 2018-06-07 DIAGNOSIS — Z13220 Encounter for screening for lipoid disorders: Secondary | ICD-10-CM | POA: Diagnosis not present

## 2018-06-07 DIAGNOSIS — M25562 Pain in left knee: Secondary | ICD-10-CM | POA: Diagnosis not present

## 2018-06-07 DIAGNOSIS — Z23 Encounter for immunization: Secondary | ICD-10-CM

## 2018-06-07 DIAGNOSIS — Z1159 Encounter for screening for other viral diseases: Secondary | ICD-10-CM | POA: Diagnosis not present

## 2018-06-07 LAB — UA/M W/RFLX CULTURE, ROUTINE
Bilirubin, UA: NEGATIVE
Glucose, UA: NEGATIVE
Ketones, UA: NEGATIVE
Leukocytes, UA: NEGATIVE
Nitrite, UA: NEGATIVE
Protein, UA: NEGATIVE
Specific Gravity, UA: 1.015 (ref 1.005–1.030)
UUROB: 0.2 mg/dL (ref 0.2–1.0)
pH, UA: 5.5 (ref 5.0–7.5)

## 2018-06-07 LAB — MICROSCOPIC EXAMINATION: WBC, UA: NONE SEEN /hpf (ref 0–5)

## 2018-06-07 MED ORDER — UMECLIDINIUM-VILANTEROL 62.5-25 MCG/INH IN AEPB: 1.0000 | INHALATION_SPRAY | Freq: Every day | RESPIRATORY_TRACT | 3 refills | Status: DC

## 2018-06-07 MED ORDER — ALBUTEROL SULFATE HFA 108 (90 BASE) MCG/ACT IN AERS: 2.0000 | INHALATION_SPRAY | Freq: Four times a day (QID) | RESPIRATORY_TRACT | 3 refills | Status: DC | PRN

## 2018-06-07 NOTE — Assessment & Plan Note (Signed)
Needs to get back in with ortho- will reach out to ortho to call her. Call with any concerns.

## 2018-06-07 NOTE — Assessment & Plan Note (Signed)
Will get in with [facility] as [place] now [place]. Information given today.

## 2018-06-07 NOTE — Assessment & Plan Note (Signed)
Rechecking labs today. Await results.  
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
## 2018-06-07 NOTE — Patient Instructions (Addendum)
[facility] at [facility]  Address: [address] [place], [place], [place] [address]  Phone: [phone]  [facility], [place],  [address]   [phone]   Health Maintenance, Female Adopting a healthy lifestyle and getting preventive care can go a long way to promote health and wellness. Talk with your health care provider about what schedule of regular examinations is right for you. This is a good chance for you to check in with your provider about disease prevention and staying healthy. In between checkups, there are plenty of things you can do on your own. Experts have done a lot of research about which lifestyle changes and preventive measures are most likely to keep you healthy. Ask your health care provider for more information. Weight and diet Eat a healthy diet  Be sure to include plenty of vegetables, fruits, low-fat dairy products, and lean protein.  Do not eat a lot of foods high in solid fats, added sugars, or salt.  Get regular exercise. This is one of the most important things you can do for your health. ? Most adults should exercise for at least 150 minutes each week. The exercise should increase your heart rate and make you sweat (moderate-intensity exercise). ? Most adults should also do strengthening exercises at least twice a week. This is in addition to the moderate-intensity exercise.  Maintain a healthy weight  Body mass index (BMI) is a measurement that can be used to identify possible weight problems. It estimates body fat based on height and weight. Your health care provider can help determine your BMI and help you achieve or maintain a healthy weight.  For females 51 years of age and older: ? A BMI below 18.5 is considered underweight. ? A BMI of 18.5 to 24.9 is normal. ? A BMI of 25 to 29.9 is considered overweight. ? A BMI of 30 and above is considered obese.  Watch levels of cholesterol and  blood lipids  You should start having your blood tested for lipids and cholesterol at 46 years of age, then have this test every 5 years.  You may need to have your cholesterol levels checked more often if: ? Your lipid or cholesterol levels are high. ? You are older than 46 years of age. ? You are at high risk for heart disease.  Cancer screening Lung Cancer  Lung cancer screening is recommended for adults 102-18 years old who are at high risk for lung cancer because of a history of smoking.  A yearly low-dose CT scan of the lungs is recommended for people who: ? Currently smoke. ? Have quit within the past 15 years. ? Have at least a 30-pack-year history of smoking. A pack year is smoking an average of one pack of cigarettes a day for 1 year.  Yearly screening should continue until it has been 15 years since you quit.  Yearly screening should stop if you develop a health problem that would prevent you from having lung cancer treatment.  Breast Cancer  Practice breast self-awareness. This means understanding how your breasts normally appear and feel.  It also means doing regular breast self-exams. Let your health care provider know about any changes, no matter how small.  If you are in your 20s or 30s, you should have a clinical breast exam (CBE) by a health care provider every 1-3 years as part of a regular health exam.  If you are 55 or older, have a  CBE every year. Also consider having a breast X-ray (mammogram) every year.  If you have a family history of breast cancer, talk to your health care provider about genetic screening.  If you are at high risk for breast cancer, talk to your health care provider about having an MRI and a mammogram every year.  Breast cancer gene (BRCA) assessment is recommended for women who have family members with BRCA-related cancers. BRCA-related cancers include: ? Breast. ? Ovarian. ? Tubal. ? Peritoneal cancers.  Results of the assessment  will determine the need for genetic counseling and BRCA1 and BRCA2 testing.  Cervical Cancer Your health care provider may recommend that you be screened regularly for cancer of the pelvic organs (ovaries, uterus, and vagina). This screening involves a pelvic examination, including checking for microscopic changes to the surface of your cervix (Pap test). You may be encouraged to have this screening done every 3 years, beginning at age 79.  For women ages 37-65, health care providers may recommend pelvic exams and Pap testing every 3 years, or they may recommend the Pap and pelvic exam, combined with testing for human papilloma virus (HPV), every 5 years. Some types of HPV increase your risk of cervical cancer. Testing for HPV may also be done on women of any age with unclear Pap test results.  Other health care providers may not recommend any screening for nonpregnant women who are considered low risk for pelvic cancer and who do not have symptoms. Ask your health care provider if a screening pelvic exam is right for you.  If you have had past treatment for cervical cancer or a condition that could lead to cancer, you need Pap tests and screening for cancer for at least 20 years after your treatment. If Pap tests have been discontinued, your risk factors (such as having a new sexual partner) need to be reassessed to determine if screening should resume. Some women have medical problems that increase the chance of getting cervical cancer. In these cases, your health care provider may recommend more frequent screening and Pap tests.  Colorectal Cancer  This type of cancer can be detected and often prevented.  Routine colorectal cancer screening usually begins at 46 years of age and continues through 46 years of age.  Your health care provider may recommend screening at an earlier age if you have risk factors for colon cancer.  Your health care provider may also recommend using home test kits to  check for hidden blood in the stool.  A small camera at the end of a tube can be used to examine your colon directly (sigmoidoscopy or colonoscopy). This is done to check for the earliest forms of colorectal cancer.  Routine screening usually begins at age 29.  Direct examination of the colon should be repeated every 5-10 years through 46 years of age. However, you may need to be screened more often if early forms of precancerous polyps or small growths are found.  Skin Cancer  Check your skin from head to toe regularly.  Tell your health care provider about any new moles or changes in moles, especially if there is a change in a mole's shape or color.  Also tell your health care provider if you have a mole that is larger than the size of a pencil eraser.  Always use sunscreen. Apply sunscreen liberally and repeatedly throughout the day.  Protect yourself by wearing long sleeves, pants, a wide-brimmed hat, and sunglasses whenever you are outside.  Heart disease,  diabetes, and high blood pressure  High blood pressure causes heart disease and increases the risk of stroke. High blood pressure is more likely to develop in: ? People who have blood pressure in the high end of the normal range (130-139/85-89 mm Hg). ? People who are overweight or obese. ? People who are African American.  If you are 46-79 years of age, have your blood pressure checked every 3-5 years. If you are 67 years of age or older, have your blood pressure checked every year. You should have your blood pressure measured twice-once when you are at a hospital or clinic, and once when you are not at a hospital or clinic. Record the average of the two measurements. To check your blood pressure when you are not at a hospital or clinic, you can use: ? An automated blood pressure machine at a pharmacy. ? A home blood pressure monitor.  If you are between 77 years and 40 years old, ask your health care provider if you should  take aspirin to prevent strokes.  Have regular diabetes screenings. This involves taking a blood sample to check your fasting blood sugar level. ? If you are at a normal weight and have a low risk for diabetes, have this test once every three years after 46 years of age. ? If you are overweight and have a high risk for diabetes, consider being tested at a younger age or more often. Preventing infection Hepatitis B  If you have a higher risk for hepatitis B, you should be screened for this virus. You are considered at high risk for hepatitis B if: ? You were born in a country where hepatitis B is common. Ask your health care provider which countries are considered high risk. ? Your parents were born in a high-risk country, and you have not been immunized against hepatitis B (hepatitis B vaccine). ? You have HIV or AIDS. ? You use needles to inject street drugs. ? You live with someone who has hepatitis B. ? You have had sex with someone who has hepatitis B. ? You get hemodialysis treatment. ? You take certain medicines for conditions, including cancer, organ transplantation, and autoimmune conditions.  Hepatitis C  Blood testing is recommended for: ? Everyone born from [birth year] through 1964. ? Anyone with known risk factors for hepatitis C.  Sexually transmitted infections (STIs)  You should be screened for sexually transmitted infections (STIs) including gonorrhea and chlamydia if: ? You are sexually active and are younger than 46 years of age. ? You are older than 46 years of age and your health care provider tells you that you are at risk for this type of infection. ? Your sexual activity has changed since you were last screened and you are at an increased risk for chlamydia or gonorrhea. Ask your health care provider if you are at risk.  If you do not have HIV, but are at risk, it may be recommended that you take a prescription medicine daily to prevent HIV infection. This is called  pre-exposure prophylaxis (PrEP). You are considered at risk if: ? You are sexually active and do not regularly use condoms or know the HIV status of your partner(s). ? You take drugs by injection. ? You are sexually active with a partner who has HIV.  Talk with your health care provider about whether you are at high risk of being infected with HIV. If you choose to begin PrEP, you should first be tested for HIV. You should  then be tested every 3 months for as long as you are taking PrEP. Pregnancy  If you are premenopausal and you may become pregnant, ask your health care provider about preconception counseling.  If you may become pregnant, take 400 to 800 micrograms (mcg) of folic acid every day.  If you want to prevent pregnancy, talk to your health care provider about birth control (contraception). Osteoporosis and menopause  Osteoporosis is a disease in which the bones lose minerals and strength with aging. This can result in serious bone fractures. Your risk for osteoporosis can be identified using a bone density scan.  If you are 7 years of age or older, or if you are at risk for osteoporosis and fractures, ask your health care provider if you should be screened.  Ask your health care provider whether you should take a calcium or vitamin D supplement to lower your risk for osteoporosis.  Menopause may have certain physical symptoms and risks.  Hormone replacement therapy may reduce some of these symptoms and risks. Talk to your health care provider about whether hormone replacement therapy is right for you. Follow these instructions at home:  Schedule regular health, dental, and eye exams.  Stay current with your immunizations.  Do not use any tobacco products including cigarettes, chewing tobacco, or electronic cigarettes.  If you are pregnant, do not drink alcohol.  If you are breastfeeding, limit how much and how often you drink alcohol.  Limit alcohol intake to no more  than 1 drink per day for nonpregnant women. One drink equals 12 ounces of beer, 5 ounces of wine, or 1 ounces of hard liquor.  Do not use street drugs.  Do not share needles.  Ask your health care provider for help if you need support or information about quitting drugs.  Tell your health care provider if you often feel depressed.  Tell your health care provider if you have ever been abused or do not feel safe at home. This information is not intended to replace advice given to you by your health care provider. Make sure you discuss any questions you have with your health care provider. Document Released: 12/26/2010 Document Revised: 11/18/2015 Document Reviewed: 03/16/2015 Elsevier Interactive Patient Education  [employer].

## 2018-06-08 LAB — CBC WITH DIFFERENTIAL/PLATELET
BASOS ABS: 0.1 10*3/uL (ref 0.0–0.2)
Basos: 1 %
EOS (ABSOLUTE): 0.4 10*3/uL (ref 0.0–0.4)
Eos: 6 %
HEMOGLOBIN: 11.2 g/dL (ref 11.1–15.9)
Hematocrit: 35.5 % (ref 34.0–46.6)
IMMATURE GRANS (ABS): 0 10*3/uL (ref 0.0–0.1)
Immature Granulocytes: 1 %
LYMPHS: 32 %
Lymphocytes Absolute: 2 10*3/uL (ref 0.7–3.1)
MCH: 26.3 pg — ABNORMAL LOW (ref 26.6–33.0)
MCHC: 31.5 g/dL (ref 31.5–35.7)
MCV: 83 fL (ref 79–97)
MONOCYTES: 8 %
Monocytes Absolute: 0.5 10*3/uL (ref 0.1–0.9)
Neutrophils Absolute: 3.2 10*3/uL (ref 1.4–7.0)
Neutrophils: 52 %
Platelets: 319 10*3/uL (ref 150–450)
RBC: 4.26 x10E6/uL (ref 3.77–5.28)
RDW: 13.7 % (ref 12.3–15.4)
WBC: 6.2 10*3/uL (ref 3.4–10.8)

## 2018-06-08 LAB — COMPREHENSIVE METABOLIC PANEL
ALK PHOS: 55 IU/L (ref 39–117)
ALT: 16 IU/L (ref 0–32)
AST: 12 IU/L (ref 0–40)
Albumin/Globulin Ratio: 1.7 (ref 1.2–2.2)
Albumin: 3.8 g/dL (ref 3.5–5.5)
BUN/Creatinine Ratio: 19 (ref 9–23)
BUN: 9 mg/dL (ref 6–24)
CHLORIDE: 106 mmol/L (ref 96–106)
CO2: 21 mmol/L (ref 20–29)
CREATININE: 0.48 mg/dL — AB (ref 0.57–1.00)
Calcium: 8.9 mg/dL (ref 8.7–10.2)
GFR calc non Af Amer: 118 mL/min/{1.73_m2} (ref >59)
GFR, EST AFRICAN AMERICAN: 136 mL/min/{1.73_m2} (ref >59)
GLUCOSE: 86 mg/dL (ref 65–99)
Globulin, Total: 2.3 g/dL (ref 1.5–4.5)
Potassium: 4 mmol/L (ref 3.5–5.2)
Sodium: 141 mmol/L (ref 134–144)
TOTAL PROTEIN: 6.1 g/dL (ref 6.0–8.5)

## 2018-06-08 LAB — HIV ANTIBODY (ROUTINE TESTING W REFLEX): HIV Screen 4th Generation wRfx: NONREACTIVE

## 2018-06-08 LAB — LIPID PANEL W/O CHOL/HDL RATIO
CHOLESTEROL TOTAL: 159 mg/dL (ref 100–199)
HDL: 51 mg/dL (ref >39)
LDL CALC: 88 mg/dL (ref 0–99)
TRIGLYCERIDES: 99 mg/dL (ref 0–149)
VLDL CHOLESTEROL CAL: 20 mg/dL (ref 5–40)

## 2018-06-08 LAB — HEPATITIS PANEL, ACUTE
Hep A IgM: NEGATIVE
Hep B C IgM: NEGATIVE
Hepatitis B Surface Ag: NEGATIVE

## 2018-06-08 LAB — RPR: RPR: NONREACTIVE

## 2018-06-08 LAB — TSH: TSH: 2.93 u[IU]/mL (ref 0.450–4.500)

## 2018-06-08 LAB — HSV(HERPES SIMPLEX VRS) I + II AB-IGG
HSV 1 Glycoprotein G Ab, IgG: <0.91 index (ref 0.00–0.90)
HSV 2 IgG, Type Spec: >23.6 index — ABNORMAL HIGH (ref 0.00–0.90)

## 2018-06-09 ENCOUNTER — Encounter: Payer: Self-pay | Admitting: Family Medicine

## 2018-06-10 ENCOUNTER — Telehealth: Payer: Self-pay | Admitting: Family Medicine

## 2018-06-10 NOTE — Telephone Encounter (Signed)
Copied from CRM [id number]. Topic: Referral - Request for Referral >> Jun 10, 2018  9:01 AM Fanny BienIlderton, Jessica L wrote: Has patient seen PCP for this complaint?no Referral for which specialty:pain clinic  Preferred provider/office: Reason for referral:manage pain medication management

## 2018-06-10 NOTE — Telephone Encounter (Signed)
Does not need to see pain management. Needs to follow up with orthopedics and get her surgery. If she is still having pain after that, we can refer to pain management.

## 2018-06-11 NOTE — Telephone Encounter (Signed)
Patient notified and verbalized understanding. [facility] 06/21/2018 at 10:15AM. Patient notified of appointment and understood as well.

## 2018-06-14 LAB — CYTOLOGY - PAP
DIAGNOSIS: NEGATIVE
HPV: NOT DETECTED

## 2018-07-15 DIAGNOSIS — M1712 Unilateral primary osteoarthritis, left knee: Secondary | ICD-10-CM | POA: Diagnosis not present

## 2018-07-15 DIAGNOSIS — M23204 Derangement of unspecified medial meniscus due to old tear or injury, left knee: Secondary | ICD-10-CM | POA: Diagnosis not present

## 2018-07-15 DIAGNOSIS — G8929 Other chronic pain: Secondary | ICD-10-CM | POA: Diagnosis not present

## 2018-07-15 DIAGNOSIS — M25562 Pain in left knee: Secondary | ICD-10-CM | POA: Diagnosis not present

## 2018-07-25 ENCOUNTER — Encounter: Payer: Self-pay | Admitting: Emergency Medicine

## 2018-07-25 ENCOUNTER — Ambulatory Visit
Admission: EM | Admit: 2018-07-25 | Discharge: 2018-07-25 | Disposition: A | Discharge status: Home / Self Care | Payer: Medicare Other | Attending: Emergency Medicine | Admitting: Emergency Medicine

## 2018-07-25 DIAGNOSIS — K6289 Other specified diseases of anus and rectum: Secondary | ICD-10-CM | POA: Diagnosis not present

## 2018-07-25 DIAGNOSIS — K644 Residual hemorrhoidal skin tags: Secondary | ICD-10-CM | POA: Diagnosis not present

## 2018-07-25 DIAGNOSIS — K5641 Fecal impaction: Secondary | ICD-10-CM | POA: Insufficient documentation

## 2018-07-25 LAB — POC HEMOCCULT BLD/STL (OFFICE/1-CARD/DIAGNOSTIC): Fecal Occult Blood, POC: NEGATIVE

## 2018-07-25 MED ORDER — DOCUSATE SODIUM 100 MG PO CAPS: 100.0000 mg | ORAL_CAPSULE | Freq: Two times a day (BID) | ORAL | 0 refills | Status: DC

## 2018-07-25 MED ORDER — POLYETHYLENE GLYCOL 3350 17 GM/SCOOP PO POWD: 17.0000 g | Freq: Once | ORAL | 0 refills | Status: AC

## 2018-07-25 MED ORDER — HYDROCORTISONE 2.5 % RE CREA: TOPICAL_CREAM | RECTAL | 0 refills | Status: DC

## 2018-07-25 NOTE — ED Triage Notes (Signed)
Pt presents to [facility] for assessment of rectal bleeding and constipation x 3 days.

## 2018-07-25 NOTE — ED Notes (Signed)
Patient able to ambulate independently  
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
## 2018-07-25 NOTE — Discharge Instructions (Addendum)
We removed a decent amount of stool from your rectum, this impaction is likely causing the stool to leak  Please apply Anusol HC cream twice daily to her rectum to help with hemorrhoid pain May consider sitting on a warm tea bag to further help with hemorrhoid or warm soaks to rectum  Please use Miralax for moderate to severe constipation. Take a capful once a day for the next 2-3 days. Please also start docusate stool softener, twice a day for at least 1 week. If stools become loose, cut down to once a day for another week. If stools remain loose, cut back to 1 pill every other day for a third week. You can stop docusate thereafter and resume as needed for constipation.  To help reduce constipation and promote bowel health: 1. Drink at least 64 ounces of water each day 2. Eat plenty of fiber (fruits, vegetables, whole grains, legumes) 3. Be physically active or exercise including walking, jogging, swimming, yoga, etc. 4. For active constipation use a stool softener (docusate) or an osmotic laxative (like Miralax) each day, or as needed.

## 2018-12-06 ENCOUNTER — Telehealth: Payer: Self-pay | Admitting: Family Medicine

## 2018-12-06 NOTE — Chronic Care Management (AMB) (Signed)
°  Chronic Care Management   Outreach Note  12/06/2018 Name: Sandra Jensen MRN: [id number] DOB: May 12, 1971  Referred by: Valerie Roys, DO Reason for referral : No chief complaint on file.   An unsuccessful telephone outreach was attempted today. The patient was referred to the case management team by for assistance with chronic care management and care coordination.   Follow Up Plan: The care management team will reach out to the patient again over the next 7 days.   [place]  [email or web address]   ??[phone]

## 2018-12-09 ENCOUNTER — Telehealth: Payer: Self-pay | Admitting: Family Medicine

## 2018-12-09 ENCOUNTER — Ambulatory Visit: Payer: Medicare Other | Admitting: Family Medicine

## 2018-12-09 NOTE — Telephone Encounter (Signed)
Called pt to go over script screening no answer left vm  °

## 2018-12-10 NOTE — Chronic Care Management (AMB) (Signed)
°  Chronic Care Management   Outreach Note  12/10/2018 Name: Sandra Jensen MRN: [id number] DOB: 1970/07/04  Referred by: Valerie Roys, DO Reason for referral : Chronic Care Management (Initial CCM outreach ) and Chronic Care Management (Second CCM outreach was unsuccessful.)   A second unsuccessful telephone outreach was attempted today. The patient was referred to the case management team for assistance with chronic care management and care coordination.   Follow Up Plan: A HIPPA compliant phone message was left for the patient providing contact information and requesting a return call.  The care management team will reach out to the patient again over the next 7 days.  If patient returns call to provider office, please advise to call [place] [facility] [place]  [email or web address]   ??[phone]

## 2018-12-12 NOTE — Chronic Care Management (AMB) (Signed)
°  Chronic Care Management   Outreach Note  12/12/2018 Name: Sandra Jensen MRN: [id number] DOB: 02-Feb-1971  Referred by: Valerie Roys, DO Reason for referral : Chronic Care Management (Initial CCM outreach ), Chronic Care Management (Second CCM outreach was unsuccessful.), and Chronic Care Management (Third CCM outreach call was unsuccessful. )   Third unsuccessful telephone outreach was attempted today. The patient was referred to the case management team for assistance with chronic care management and care coordination. The patient's primary care provider has been notified of our unsuccessful attempts to make or maintain contact with the patient. The care management team is pleased to engage with this patient at any time in the future should he/she be interested in assistance from the care management team.   Follow Up Plan: The care management team is available to follow up with the patient after provider conversation with the patient regarding recommendation for care management engagement and subsequent re-referral to the care management team.   [place]  [email or web address]   ??[phone]

## 2019-01-17 ENCOUNTER — Encounter: Payer: Self-pay | Admitting: Emergency Medicine

## 2019-01-17 ENCOUNTER — Other Ambulatory Visit: Payer: Self-pay

## 2019-01-17 ENCOUNTER — Ambulatory Visit
Admission: EM | Admit: 2019-01-17 | Discharge: 2019-01-17 | Disposition: A | Discharge status: Home / Self Care | Payer: Medicare Other | Attending: Physician Assistant | Admitting: Physician Assistant

## 2019-01-17 DIAGNOSIS — Z20828 Contact with and (suspected) exposure to other viral communicable diseases: Secondary | ICD-10-CM

## 2019-01-17 DIAGNOSIS — T148XXA Other injury of unspecified body region, initial encounter: Secondary | ICD-10-CM | POA: Diagnosis not present

## 2019-01-17 DIAGNOSIS — Z7689 Persons encountering health services in other specified circumstances: Secondary | ICD-10-CM | POA: Diagnosis not present

## 2019-01-17 NOTE — Discharge Instructions (Signed)
COVID testing ordered, you will receive results in 7 days. If it is negative, you will not receive a call. If positive, you will. Keep wound/abrasion clean and dry. Clean with soap and water. You can dress wound with bacitracin for the first 2-3 days. If noticing spreading redness, increased warmth, drainage, follow up for reevaluation needed.

## 2019-01-17 NOTE — ED Notes (Signed)
Patient able to ambulate independently  
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
## 2019-01-21 LAB — NOVEL CORONAVIRUS, NAA: SARS-CoV-2, NAA: NOT DETECTED

## 2019-01-23 ENCOUNTER — Encounter (HOSPITAL_COMMUNITY): Payer: Self-pay

## 2019-04-24 ENCOUNTER — Other Ambulatory Visit: Payer: Self-pay

## 2019-04-24 ENCOUNTER — Ambulatory Visit
Admission: EM | Admit: 2019-04-24 | Discharge: 2019-04-24 | Disposition: A | Discharge status: Home / Self Care | Payer: Medicare Other | Attending: Emergency Medicine | Admitting: Emergency Medicine

## 2019-04-24 DIAGNOSIS — T148XXA Other injury of unspecified body region, initial encounter: Secondary | ICD-10-CM | POA: Diagnosis not present

## 2019-04-24 DIAGNOSIS — Z590 Homelessness: Secondary | ICD-10-CM

## 2019-04-24 MED ORDER — DICLOFENAC SODIUM 1 % TD GEL: 2.0000 g | Freq: Four times a day (QID) | TRANSDERMAL | 0 refills | Status: DC

## 2019-04-24 MED ORDER — ALBUTEROL SULFATE HFA 108 (90 BASE) MCG/ACT IN AERS: 2.0000 | INHALATION_SPRAY | Freq: Four times a day (QID) | RESPIRATORY_TRACT | 0 refills | Status: DC | PRN

## 2019-04-24 MED ORDER — CYCLOBENZAPRINE HCL 5 MG PO TABS: 5.0000 mg | ORAL_TABLET | Freq: Two times a day (BID) | ORAL | 0 refills | Status: AC | PRN

## 2019-04-24 MED ORDER — MELOXICAM 7.5 MG PO TABS: 7.5000 mg | ORAL_TABLET | Freq: Every day | ORAL | 0 refills | Status: DC
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
## 2019-04-24 NOTE — Discharge Instructions (Addendum)
Take muscle relaxer as needed for severe pain, spasm. When taking Prozac with Mobic, is important to monitor for GI bleeding: Signs/symptoms include severe abdominal pain, fatigue, blood in stool, dark/black/tarry stools. May ice, rest, elevate the area(s) of pain.   You may also use hot compresses/warm wash rags to relieve muscle tightness. May use OTC Tylenol, ibuprofen as needed for pain. Return if you develop worsening pain, chest pain, difficulty breathing.
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
## 2019-07-08 ENCOUNTER — Ambulatory Visit
Admission: EM | Admit: 2019-07-08 | Discharge: 2019-07-08 | Disposition: A | Discharge status: Home / Self Care | Payer: Medicare Other | Attending: Emergency Medicine | Admitting: Emergency Medicine

## 2019-07-08 ENCOUNTER — Encounter: Payer: Self-pay | Admitting: Emergency Medicine

## 2019-07-08 ENCOUNTER — Other Ambulatory Visit: Payer: Self-pay

## 2019-07-08 DIAGNOSIS — M545 Low back pain, unspecified: Secondary | ICD-10-CM

## 2019-07-08 DIAGNOSIS — Z87828 Personal history of other (healed) physical injury and trauma: Secondary | ICD-10-CM | POA: Diagnosis not present

## 2019-07-08 DIAGNOSIS — M25562 Pain in left knee: Secondary | ICD-10-CM | POA: Diagnosis not present

## 2019-07-08 MED ORDER — CYCLOBENZAPRINE HCL 5 MG PO TABS: 5.0000 mg | ORAL_TABLET | Freq: Two times a day (BID) | ORAL | 0 refills | Status: AC | PRN

## 2019-07-08 MED ORDER — MELOXICAM 7.5 MG PO TABS: 7.5000 mg | ORAL_TABLET | Freq: Every day | ORAL | 0 refills | Status: DC
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
## 2019-07-08 NOTE — ED Provider Notes (Signed)
[facility]    CSN: [id number] Arrival date & time: 07/08/19  1736      History   Chief Complaint Chief Complaint  Patient presents with  . Back Pain  . Knee Pain    HPI Sandra Jensen is a 49 y.o. female with history of obesity, osteoporosis, arthritis, chronic back and knee pain presenting for acute back pain, left knee pain.  States she was attempting to help her sister from falling backwards on concrete.  Denies fall herself, trauma to the area, though noted later on her back and knee were hurting her.  Patient states that her knee brace ripped when she fell.  Denies deformity, inability to bear weight, chest pain, difficulty breathing.  Has not tried any thing for this: Requesting refill of chronic medications Flexeril and Mobic.   Past Medical History:  Diagnosis Date  . ADHD   . Allergy   . Anxiety   . Arthritis   . Asthma   . Bipolar 1 disorder, depressed ([place])   . COPD (chronic obstructive pulmonary disease) ([place])   . Depression   . Heart murmur   . Kidney infection   . Leg cramps   . Medial meniscus tear    Bilateral  . Osteoporosis   . Personality disorder ([place])   . PONV (postoperative nausea and vomiting)   . PTSD (post-traumatic stress disorder)   . PTSD (post-traumatic stress disorder)   . Schizophrenia ([place])   . Torn ACL    Right and left    Patient Active Problem List   Diagnosis Date Noted  . Vitamin B12 deficiency 04/22/2018  . 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 Osteoarthritis of knee (Left) 04/22/2018  . Arthropathy of knee (Bilateral) (L>R) 04/22/2018  . Degenerative tear of posterior horn of medial meniscus (Left) 04/22/2018  . Anterior cruciate ligament disruption, sequela (Left) 04/22/2018  . Deficiency of posterior cruciate ligament of knee (Left) 04/22/2018  . Chronic knee pain (Primary Area of Pain) (Left) 04/04/2018  . Chronic pain syndrome 04/04/2018  . Long term current use of opiate analgesic 04/04/2018   . Pharmacologic therapy 04/04/2018  . Disorder of skeletal system 04/04/2018  . Problems influencing health status 04/04/2018  . Major depressive disorder 03/12/2018  . Moderate protein-calorie malnutrition ([place]) 03/11/2018  . Depression, major, recurrent, moderate ([place]) 03/10/2018  . Cocaine abuse with cocaine-induced mood disorder ([place]) 09/30/2017  . 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 Chronic knee pain (Bilateral) (L>R) 07/25/2016  . Bipolar disorder ([place]) 06/05/2014  . PTSD (post-traumatic stress disorder) 06/05/2014    Past Surgical History:  Procedure Laterality Date  . CESAREAN SECTION    . RADIOLOGY WITH ANESTHESIA Left 04/19/2017   Procedure: MRI OF LEFT KNEE WITHOUT CONTRAST;  Surgeon: Radiologist, Medication, MD;  Location: [facility];  Service: Radiology;  Laterality: Left;  . TUBAL LIGATION     Burned    OB History   No obstetric history on file.      Home Medications    Prior to Admission medications   Medication Sig Start Date End Date Taking? Authorizing Provider  albuterol (VENTOLIN HFA) 108 (90 Base) MCG/ACT inhaler Inhale 2 puffs into the lungs every 6 (six) hours as needed for wheezing or shortness of breath. 04/24/19  Yes Hall-Potvin, [place], PA-C  cyclobenzaprine (FLEXERIL) 5 MG tablet Take 1 tablet (5 mg total) by mouth 2 (two) times daily as needed for up to 5 days for muscle spasms. 07/08/19 07/13/19  Hall-Potvin, [place], PA-C  docusate sodium (COLACE) 100  MG capsule Take 1 capsule (100 mg total) by mouth 2 (two) times daily. 07/25/18   Wieters, Hallie C, PA-C  FLUoxetine (PROZAC) 20 MG capsule Take 1 capsule (20 mg total) by mouth daily. 09/30/17   Charm Rings, NP  hydrocortisone (ANUSOL-HC) 2.5 % rectal cream Apply rectally 2 times daily 07/25/18   Wieters, Hallie C, PA-C  meloxicam (MOBIC) 7.5 MG tablet Take 1 tablet (7.5 mg total) by mouth daily. 07/08/19   Hall-Potvin, [place], PA-C  umeclidinium-vilanterol (ANORO ELLIPTA) 62.5-25 MCG/INH AEPB Inhale 1 puff into  the lungs daily. 06/07/18   Dorcas Carrow, DO    Family History Family History  Adopted: Yes  Problem Relation Age of Onset  . Diabetes Paternal Aunt   . Alcohol abuse Mother   . COPD Mother   . Alcohol abuse Father   . Heart disease Father   . Mental illness Sister   . Mental illness Brother   . Drug abuse Brother   . Mental illness Daughter        Depression  . Autism Son   . Cancer Maternal Aunt        Breast  . Heart disease Paternal Grandmother   . Hypertension Paternal Grandmother     Social History Social History   Tobacco Use  . Smoking status: Former Smoker    Types: Cigarettes  . Smokeless tobacco: Never Used  . Tobacco comment: 1 cigarette a day  Substance Use Topics  . Alcohol use: No  . Drug use: No     Allergies   Acetaminophen, Cephalexin, and Vicodin [hydrocodone-acetaminophen]   Review of Systems As per HPI   Physical Exam Triage Vital Signs ED Triage Vitals  Enc Vitals Group     BP 07/08/19 1744 114/78     Pulse Rate 07/08/19 1744 81     Resp 07/08/19 1744 16     Temp 07/08/19 1744 97.7 F (36.5 C)     Temp Source 07/08/19 1744 Oral     SpO2 07/08/19 1744 99 %     Weight --      Height --      Head Circumference --      Peak Flow --      Pain Score 07/08/19 1756 8     Pain Loc --      Pain Edu? --      Excl. in GC? --    No data found.  Updated Vital Signs BP 114/78 (BP Location: Right Arm)   Pulse 81   Temp 97.7 F (36.5 C) (Oral)   Resp 16   LMP 07/01/2019   SpO2 99%   Visual Acuity Right Eye Distance:   Left Eye Distance:   Bilateral Distance:    Right Eye Near:   Left Eye Near:    Bilateral Near:     Physical Exam Constitutional:      General: She is not in acute distress.    Appearance: She is obese. She is not ill-appearing.  HENT:     Head: Normocephalic and atraumatic.  Eyes:     General: No scleral icterus.    Pupils: Pupils are equal, round, and reactive to light.  Cardiovascular:     Rate  and Rhythm: Normal rate.  Pulmonary:     Effort: Pulmonary effort is normal.  Musculoskeletal:     Cervical back: Normal range of motion. No rigidity.     Comments: Left knee w/o swelling.  Decreased full flexion second to pain.  Full extension intact.  Antalgic favoring left.  No spinous process tenderness thoracic spine.  Patient does have bilateral paraspinal tenderness deformity.  Skin:    Coloration: Skin is not jaundiced or pale.  Neurological:     Mental Status: She is alert and oriented to person, place, and time.      UC Treatments / Results  Labs (all labs ordered are listed, but only abnormal results are displayed) Labs Reviewed - No data to display  EKG   Radiology No results found.  Procedures Procedures (including critical care time)  Medications Ordered in UC Medications - No data to display  Initial Impression / Assessment and Plan / UC Course  I have reviewed the triage vital signs and the nursing notes.  Pertinent labs & imaging results that were available during my care of the patient were reviewed by me and considered in my medical decision making (see chart for details).    Patient appears well in office, does have some diffuse tenderness in areas reported.  Increased pain likely to strain as opposed to fracture the mechanism of injury.  Will refill medications as outlined below.  Patient electing to purchase knee brace online.  We will continue chronic management with PCP/Specialist as needed.  Return precautions discussed, patient verbalized understanding and is agreeable to plan. Final Clinical Impressions(s) / UC Diagnoses   Final diagnoses:  Acute bilateral low back pain without sciatica  Acute pain of left knee  History of torn meniscus of left knee  History of tear of ACL (anterior cruciate ligament)     Discharge Instructions     May take Mobic for pain: Do not take Aleve while taking this medication. Only take muscle relaxer before  bedtime as this can make you very sleepy: Do not drink/drive while using it.    ED Prescriptions    Medication Sig Dispense Auth. Provider   meloxicam (MOBIC) 7.5 MG tablet Take 1 tablet (7.5 mg total) by mouth daily. 14 tablet Hall-Potvin, [place], PA-C   cyclobenzaprine (FLEXERIL) 5 MG tablet Take 1 tablet (5 mg total) by mouth 2 (two) times daily as needed for up to 5 days for muscle spasms. 10 tablet Hall-Potvin, [place], PA-C     PDMP not reviewed this encounter.   Hall-Potvin, [place], [place] 07/11/19 2009

## 2019-07-08 NOTE — Discharge Instructions (Signed)
May take Mobic for pain: Do not take Aleve while taking this medication. Only take muscle relaxer before bedtime as this can make you very sleepy: Do not drink/drive while using it.

## 2019-07-08 NOTE — ED Notes (Signed)
Patient able to ambulate independently  

## 2019-07-21 ENCOUNTER — Encounter: Payer: Self-pay | Admitting: Nurse Practitioner

## 2019-07-21 ENCOUNTER — Ambulatory Visit (INDEPENDENT_AMBULATORY_CARE_PROVIDER_SITE_OTHER): Payer: Medicare Other | Admitting: Nurse Practitioner

## 2019-07-21 ENCOUNTER — Ambulatory Visit: Payer: Medicare Other | Admitting: Family Medicine

## 2019-07-21 ENCOUNTER — Other Ambulatory Visit: Payer: Self-pay

## 2019-07-21 VITALS — BP 111/74 | HR 83 | Temp 98.0°F | Ht 65.0 in | Wt 185.2 lb

## 2019-07-21 DIAGNOSIS — G8929 Other chronic pain: Secondary | ICD-10-CM | POA: Diagnosis not present

## 2019-07-21 DIAGNOSIS — F3160 Bipolar disorder, current episode mixed, unspecified: Secondary | ICD-10-CM

## 2019-07-21 DIAGNOSIS — M25562 Pain in left knee: Secondary | ICD-10-CM

## 2019-07-21 DIAGNOSIS — M25561 Pain in right knee: Secondary | ICD-10-CM | POA: Diagnosis not present

## 2019-07-21 MED ORDER — ANORO ELLIPTA 62.5-25 MCG/INH IN AEPB: 1.0000 | INHALATION_SPRAY | Freq: Every day | RESPIRATORY_TRACT | 3 refills | Status: DC

## 2019-07-21 MED ORDER — MELOXICAM 7.5 MG PO TABS: 7.5000 mg | ORAL_TABLET | Freq: Every day | ORAL | 0 refills | Status: DC

## 2019-07-21 MED ORDER — ALBUTEROL SULFATE HFA 108 (90 BASE) MCG/ACT IN AERS: 2.0000 | INHALATION_SPRAY | Freq: Four times a day (QID) | RESPIRATORY_TRACT | 0 refills | Status: DC | PRN

## 2019-07-21 MED ORDER — FLUOXETINE HCL 20 MG PO CAPS: 20.0000 mg | ORAL_CAPSULE | Freq: Every day | ORAL | 0 refills | Status: DC

## 2019-07-21 MED ORDER — HYDROCORTISONE (PERIANAL) 2.5 % EX CREA: 1.0000 "application " | TOPICAL_CREAM | Freq: Two times a day (BID) | CUTANEOUS | 0 refills | Status: DC

## 2019-07-21 NOTE — Assessment & Plan Note (Signed)
Uncontrolled currently as patient is out of medication.  Will refill Prozac.  Referral sent to Psychiatry per patient's request.  Follow up in 2 weeks for routine lab work physical.

## 2019-07-21 NOTE — Assessment & Plan Note (Signed)
Chronic, ongoing.  Patient has seen orthopedics and pain management in past for knee pain.  Will send in referral for pain management today.  Patient advised to contact her Orthopedist at [facility] clinic as she was a patient there recently.  Explained that Mobic would be refilled today but would not be refilled again due to the side effects of taking long-term and that this medication is not safe for long-term use.  BMET ordered today to check on kidney function.

## 2019-07-21 NOTE — Progress Notes (Signed)
BP 111/74 (BP Location: Left Arm, Patient Position: Sitting, Cuff Size: Normal)   Pulse 83   Temp 98 F (36.7 C) (Oral)   Ht 5\' 5"  (1.651 m)   Wt 185 lb 3.2 oz (84 kg)   LMP 07/01/2019   SpO2 96%   BMI 30.82 kg/m    Subjective:    Patient ID: [id number], female    DOB: 03/17/1971, 49 y.o.   MRN: [id number]  HPI: Sandra Jensen is a 49 y.o. female  Chief Complaint  Patient presents with  . Knee Pain    Left knee  . Referral    Psychiatry, Orthopedics  . COPD    Would like to change Anoro medicaiton, states it's not working anymore  . Medication Refill    Fluoxetine, Anusol Cream, Meloxicam   KNEE PAIN Duration: months Involved knee: left Mechanism of injury: trauma Location:diffuse Onset: constant Severity: severe  Quality:  sharp and shooting Frequency: constant Radiation: yes, to toes Aggravating factors: weight bearing and walking  Alleviating factors: heat, NSAIDs and brace, cane Status: worse Treatments attempted: heat, aleve and physical therapy, pain clinic prescribed Roxicodone, Mobic Relief with NSAIDs?:  significant  Weakness with weight bearing or walking: yes Sensation of giving way: yes Locking: yes Popping: yes Bruising: no Swelling: yes Redness: no Paresthesias/decreased sensation: no Fevers: no  The patient is also asking for referrals to Orthopedics for her knee pain, Psychiatry for her ongoing mental health needs, and the pain clinic for long-term management of her chronic pain.  She also states she and her sister recently became homeless and needs refills on all of her medications.  Allergies  Allergen Reactions  . Acetaminophen Diarrhea    Other reaction(s): Nausea Only  . Cephalexin Itching and Nausea And Vomiting    Other reaction(s): Itching  . Vicodin [Hydrocodone-Acetaminophen] Nausea And Vomiting   Outpatient Encounter Medications as of 07/21/2019  Medication Sig  . albuterol (VENTOLIN HFA) 108 (90 Base) MCG/ACT  inhaler Inhale 2 puffs into the lungs every 6 (six) hours as needed for wheezing or shortness of breath.  . docusate sodium (COLACE) 100 MG capsule Take 1 capsule (100 mg total) by mouth 2 (two) times daily.  07/23/2019 FLUoxetine (PROZAC) 20 MG capsule Take 1 capsule (20 mg total) by mouth daily.  . meloxicam (MOBIC) 7.5 MG tablet Take 1 tablet (7.5 mg total) by mouth daily.  Marland Kitchen umeclidinium-vilanterol (ANORO ELLIPTA) 62.5-25 MCG/INH AEPB Inhale 1 puff into the lungs daily.  . [DISCONTINUED] albuterol (VENTOLIN HFA) 108 (90 Base) MCG/ACT inhaler Inhale 2 puffs into the lungs every 6 (six) hours as needed for wheezing or shortness of breath.  . [DISCONTINUED] FLUoxetine (PROZAC) 20 MG capsule Take 1 capsule (20 mg total) by mouth daily.  . [DISCONTINUED] hydrocortisone (ANUSOL-HC) 2.5 % rectal cream Apply rectally 2 times daily  . [DISCONTINUED] meloxicam (MOBIC) 7.5 MG tablet Take 1 tablet (7.5 mg total) by mouth daily.  . [DISCONTINUED] umeclidinium-vilanterol (ANORO ELLIPTA) 62.5-25 MCG/INH AEPB Inhale 1 puff into the lungs daily.  . hydrocortisone (ANUSOL-HC) 2.5 % rectal cream Place 1 application rectally 2 (two) times daily.   No facility-administered encounter medications on file as of 07/21/2019.   Patient Active Problem List   Diagnosis Date Noted  . Vitamin B12 deficiency 04/22/2018  . 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 Osteoarthritis of knee (Left) 04/22/2018  . Arthropathy of knee (Bilateral) (L>R) 04/22/2018  . Degenerative tear of posterior horn of medial meniscus (Left) 04/22/2018  . Anterior cruciate  ligament disruption, sequela (Left) 04/22/2018  . Deficiency of posterior cruciate ligament of knee (Left) 04/22/2018  . Chronic knee pain (Primary Area of Pain) (Left) 04/04/2018  . Chronic pain syndrome 04/04/2018  . Long term current use of opiate analgesic 04/04/2018  . Disorder of skeletal system 04/04/2018  . Major depressive disorder 03/12/2018  . Cocaine abuse with  cocaine-induced mood disorder ([place]) 09/30/2017  . 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 Bipolar disorder ([place]) 06/05/2014  . PTSD (post-traumatic stress disorder) 06/05/2014   Past Medical History:  Diagnosis Date  . ADHD   . Allergy   . Anxiety   . Arthritis   . Asthma   . Bipolar 1 disorder, depressed ([place])   . COPD (chronic obstructive pulmonary disease) ([place])   . Depression   . Heart murmur   . Kidney infection   . Leg cramps   . Medial meniscus tear    Bilateral  . Osteoporosis   . Personality disorder ([place])   . PONV (postoperative nausea and vomiting)   . PTSD (post-traumatic stress disorder)   . PTSD (post-traumatic stress disorder)   . Schizophrenia ([place])   . Torn ACL    Right and left   Fall Risk  07/21/2019 04/22/2018 04/04/2018  Falls in the past year? 1 No Yes  Number falls in past yr: 1 - 1  Injury with Fall? 1 - No  Risk for fall due to : History of fall(s);Impaired balance/gait;Orthopedic patient;Impaired mobility - -  Follow up Education provided;Falls evaluation completed;Falls prevention discussed;Follow up appointment - -   Depression screen [facility] 2/9 07/21/2019 06/07/2018 06/07/2018 04/22/2018 04/04/2018  Decreased Interest 2 1 1  0 0  Down, Depressed, Hopeless 3 2 2  0 0  PHQ - 2 Score 5 3 3  0 0  Altered sleeping 2 1 1  - 1  Tired, decreased energy 3 2 2  - 1  Change in appetite 2 2 2  - 0  Feeling bad or failure about yourself  3 3 3  - 0  Trouble concentrating 3 3 3  - 1  Moving slowly or fidgety/restless 2 1 1  - 0  Suicidal thoughts 2 0 0 - 0  PHQ-9 Score 22 15 15  - 3  Difficult doing work/chores Very difficult - Very difficult - -   GAD 7 : Generalized Anxiety Score 07/21/2019 06/07/2018 04/02/2018  Nervous, Anxious, on Edge 3 3 3   Control/stop worrying 3 3 1   Worry too much - different things 3 3 2   Trouble relaxing 3 2 3   Restless 3 2 3   Easily annoyed or irritable 3 3 3   Afraid - awful might happen 2 2 2   Total GAD 7 Score 20 18 17   Anxiety Difficulty Very  difficult - -   Relevant past medical, surgical, family and social history reviewed and updated as indicated. Interim medical history since our last visit reviewed.  Review of Systems  Constitutional: Positive for activity change and fatigue. Negative for appetite change and fever.  HENT: Negative.  Negative for congestion, rhinorrhea, sinus pain and sneezing.   Musculoskeletal: Positive for gait problem, joint swelling and myalgias. Negative for neck pain and neck stiffness.  Skin: Negative for color change, pallor and rash.  Neurological: Negative for dizziness, tremors, light-headedness, numbness and headaches.  Psychiatric/Behavioral: Positive for decreased concentration and sleep disturbance. Negative for confusion. The patient is nervous/anxious.     Per HPI unless specifically indicated above     Objective:    BP 111/74 (BP Location: Left Arm, Patient Position: Sitting, Cuff  Size: Normal)   Pulse 83   Temp 98 F (36.7 C) (Oral)   Ht 5\' 5"  (1.651 m)   Wt 185 lb 3.2 oz (84 kg)   LMP 07/01/2019   SpO2 96%   BMI 30.82 kg/m   Wt Readings from Last 3 Encounters:  07/21/19 185 lb 3.2 oz (84 kg)  06/07/18 194 lb (88 kg)  04/22/18 190 lb (86.2 kg)    Physical Exam Vitals and nursing note reviewed.  Constitutional:      General: She is not in acute distress.    Appearance: Normal appearance. She is not ill-appearing.  HENT:     Head: Normocephalic and atraumatic.     Right Ear: External ear normal.     Left Ear: External ear normal.     Nose: Nose normal. No congestion.     Mouth/Throat:     Mouth: Mucous membranes are moist.     Pharynx: Oropharynx is clear.  Eyes:     Extraocular Movements: Extraocular movements intact.     Conjunctiva/sclera: Conjunctivae normal.  Musculoskeletal:     Right knee: No swelling, erythema or ecchymosis.     Left knee: Bony tenderness present. No swelling, erythema or ecchymosis. Decreased range of motion. Tenderness present.     Right  lower leg: No deformity. No edema.     Left lower leg: No deformity. No edema.  Skin:    General: Skin is warm and dry.     Coloration: Skin is not jaundiced or pale.  Neurological:     General: No focal deficit present.     Mental Status: She is alert and oriented to person, place, and time.     Motor: No weakness.     Gait: Gait abnormal (walks with cane and limp due to left knee pain).  Psychiatric:        Mood and Affect: Mood normal.        Behavior: Behavior normal.        Thought Content: Thought content normal.       Assessment & Plan:   Problem List Items Addressed This Visit      Other   Chronic knee pain (Primary Area of Pain) (Left) - Primary (Chronic)    Chronic, ongoing.  Patient has seen orthopedics and pain management in past for knee pain.  Will send in referral for pain management today.  Patient advised to contact her Orthopedist at [facility] clinic as she was a patient there recently.  Explained that Mobic would be refilled today but would not be refilled again due to the side effects of taking long-term and that this medication is not safe for long-term use.  BMET ordered today to check on kidney function.       Relevant Medications   FLUoxetine (PROZAC) 20 MG capsule   meloxicam (MOBIC) 7.5 MG tablet   Other Relevant Orders   Basic Metabolic Panel (BMET)   Ambulatory referral to Pain Clinic   Bipolar disorder [facility])    Uncontrolled currently as patient is out of medication.  Will refill Prozac.  Referral sent to Psychiatry per patient's request.  Follow up in 2 weeks for routine lab work physical.      Relevant Medications   FLUoxetine (PROZAC) 20 MG capsule   Other Relevant Orders   Ambulatory referral to Psychiatry       Follow up plan: Return in about 2 weeks (around 08/04/2019) for chronic disease f/u & labs.

## 2019-07-22 LAB — BASIC METABOLIC PANEL
BUN/Creatinine Ratio: 29 — ABNORMAL HIGH (ref 9–23)
BUN: 14 mg/dL (ref 6–24)
CO2: 23 mmol/L (ref 20–29)
Calcium: 8.7 mg/dL (ref 8.7–10.2)
Chloride: 110 mmol/L — ABNORMAL HIGH (ref 96–106)
Creatinine, Ser: 0.49 mg/dL — ABNORMAL LOW (ref 0.57–1.00)
GFR calc Af Amer: 134 mL/min/{1.73_m2} (ref >59)
GFR calc non Af Amer: 116 mL/min/{1.73_m2} (ref >59)
Glucose: 85 mg/dL (ref 65–99)
Potassium: 4.3 mmol/L (ref 3.5–5.2)
Sodium: 144 mmol/L (ref 134–144)

## 2019-08-04 ENCOUNTER — Other Ambulatory Visit: Payer: Self-pay

## 2019-08-04 ENCOUNTER — Encounter: Payer: Self-pay | Admitting: Nurse Practitioner

## 2019-08-04 ENCOUNTER — Ambulatory Visit (INDEPENDENT_AMBULATORY_CARE_PROVIDER_SITE_OTHER): Payer: Medicare Other

## 2019-08-04 ENCOUNTER — Ambulatory Visit (INDEPENDENT_AMBULATORY_CARE_PROVIDER_SITE_OTHER): Payer: Medicare Other | Admitting: Nurse Practitioner

## 2019-08-04 VITALS — BP 125/80 | HR 68 | Temp 98.3°F | Ht 64.5 in | Wt 183.2 lb

## 2019-08-04 DIAGNOSIS — Z13220 Encounter for screening for lipoid disorders: Secondary | ICD-10-CM

## 2019-08-04 DIAGNOSIS — J440 Chronic obstructive pulmonary disease with acute lower respiratory infection: Secondary | ICD-10-CM

## 2019-08-04 DIAGNOSIS — M25562 Pain in left knee: Secondary | ICD-10-CM | POA: Diagnosis not present

## 2019-08-04 DIAGNOSIS — Z0000 Encounter for general adult medical examination without abnormal findings: Secondary | ICD-10-CM

## 2019-08-04 DIAGNOSIS — J209 Acute bronchitis, unspecified: Secondary | ICD-10-CM

## 2019-08-04 DIAGNOSIS — Z1231 Encounter for screening mammogram for malignant neoplasm of breast: Secondary | ICD-10-CM | POA: Diagnosis not present

## 2019-08-04 DIAGNOSIS — M79671 Pain in right foot: Secondary | ICD-10-CM | POA: Diagnosis not present

## 2019-08-04 DIAGNOSIS — Z136 Encounter for screening for cardiovascular disorders: Secondary | ICD-10-CM | POA: Diagnosis not present

## 2019-08-04 DIAGNOSIS — F3160 Bipolar disorder, current episode mixed, unspecified: Secondary | ICD-10-CM | POA: Diagnosis not present

## 2019-08-04 DIAGNOSIS — J449 Chronic obstructive pulmonary disease, unspecified: Secondary | ICD-10-CM | POA: Insufficient documentation

## 2019-08-04 DIAGNOSIS — G8929 Other chronic pain: Secondary | ICD-10-CM

## 2019-08-04 LAB — UA/M W/RFLX CULTURE, ROUTINE
Bilirubin, UA: NEGATIVE
Glucose, UA: NEGATIVE
Ketones, UA: NEGATIVE
Leukocytes,UA: NEGATIVE
Nitrite, UA: NEGATIVE
Protein,UA: NEGATIVE
RBC, UA: NEGATIVE
Specific Gravity, UA: >1.03 — ABNORMAL HIGH (ref 1.005–1.030)
Urobilinogen, Ur: 0.2 mg/dL (ref 0.2–1.0)
pH, UA: 5 (ref 5.0–7.5)

## 2019-08-04 MED ORDER — FLUOXETINE HCL 20 MG PO CAPS: 20.0000 mg | ORAL_CAPSULE | Freq: Every day | ORAL | 2 refills | Status: DC

## 2019-08-04 MED ORDER — BUDESONIDE-FORMOTEROL FUMARATE 80-4.5 MCG/ACT IN AERO: 2.0000 | INHALATION_SPRAY | Freq: Two times a day (BID) | RESPIRATORY_TRACT | 3 refills | Status: DC

## 2019-08-04 MED ORDER — CYCLOBENZAPRINE HCL 10 MG PO TABS: 5.0000 mg | ORAL_TABLET | Freq: Three times a day (TID) | ORAL | 0 refills | Status: DC | PRN

## 2019-08-04 NOTE — Assessment & Plan Note (Signed)
Refill for prozac sent in again as last apparently did not go through.  Psychiatry referral pending.  CBC, TSH ordered today.
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
## 2019-08-04 NOTE — Patient Instructions (Signed)
Sandra Jensen , Thank you for taking time to come for your Medicare Wellness Visit. I appreciate your ongoing commitment to your health goals. Please review the following plan we discussed and let me know if I can assist you in the future.   Screening recommendations/referrals: Colonoscopy: discussed cologuard, information provided  Mammogram: order placed. Please call [facility] to schedule  Bone Density: not indicated  Recommended yearly ophthalmology/optometry visit for glaucoma screening and checkup Recommended yearly dental visit for hygiene and checkup  Vaccinations: Influenza vaccine: declined  Pneumococcal vaccine: not indicated  Tdap vaccine: up to date  Shingles vaccine: not indicated     Advanced directives: Advance directive discussed with you today.  Once this is complete please bring a copy in to our office so we can scan it into your chart.  Conditions/risks identified: discussed referrals placed, will follow up further on psychiatrist referral.   Next appointment: Follow up in one year for your annual wellness visit  Preventive Care 40-64 Years, Female Preventive care refers to lifestyle choices and visits with your health care provider that can promote health and wellness. What does preventive care include?  A yearly physical exam. This is also called an annual well check.  Dental exams once or twice a year.  Routine eye exams. Ask your health care provider how often you should have your eyes checked.  Personal lifestyle choices, including:  Daily care of your teeth and gums.  Regular physical activity.  Eating a healthy diet.  Avoiding tobacco and drug use.  Limiting alcohol use.  Practicing safe sex.  Taking low-dose aspirin daily starting at age 51.  Taking vitamin and mineral supplements as recommended by your health care provider. What happens during an annual well check? The services and screenings done by your health care provider during  your annual well check will depend on your age, overall health, lifestyle risk factors, and family history of disease. Counseling  Your health care provider may ask you questions about your:  Alcohol use.  Tobacco use.  Drug use.  Emotional well-being.  Home and relationship well-being.  Sexual activity.  Eating habits.  Work and work Statistician.  Method of birth control.  Menstrual cycle.  Pregnancy history. Screening  You may have the following tests or measurements:  Height, weight, and BMI.  Blood pressure.  Lipid and cholesterol levels. These may be checked every 5 years, or more frequently if you are over 56 years old.  Skin check.  Lung cancer screening. You may have this screening every year starting at age 64 if you have a 30-pack-year history of smoking and currently smoke or have quit within the past 15 years.  Fecal occult blood test (FOBT) of the stool. You may have this test every year starting at age 45.  Flexible sigmoidoscopy or colonoscopy. You may have a sigmoidoscopy every 5 years or a colonoscopy every 10 years starting at age 35.  Hepatitis C blood test.  Hepatitis B blood test.  Sexually transmitted disease (STD) testing.  Diabetes screening. This is done by checking your blood sugar (glucose) after you have not eaten for a while (fasting). You may have this done every 1-3 years.  Mammogram. This may be done every 1-2 years. Talk to your health care provider about when you should start having regular mammograms. This may depend on whether you have a family history of breast cancer.  BRCA-related cancer screening. This may be done if you have a family history of breast, ovarian, tubal,  or peritoneal cancers.  Pelvic exam and Pap test. This may be done every 3 years starting at age 44. Starting at age 29, this may be done every 5 years if you have a Pap test in combination with an HPV test.  Bone density scan. This is done to screen for  osteoporosis. You may have this scan if you are at high risk for osteoporosis. Discuss your test results, treatment options, and if necessary, the need for more tests with your health care provider. Vaccines  Your health care provider may recommend certain vaccines, such as:  Influenza vaccine. This is recommended every year.  Tetanus, diphtheria, and acellular pertussis (Tdap, Td) vaccine. You may need a Td booster every 10 years.  Zoster vaccine. You may need this after age 63.  Pneumococcal 13-valent conjugate (PCV13) vaccine. You may need this if you have certain conditions and were not previously vaccinated.  Pneumococcal polysaccharide (PPSV23) vaccine. You may need one or two doses if you smoke cigarettes or if you have certain conditions. Talk to your health care provider about which screenings and vaccines you need and how often you need them. This information is not intended to replace advice given to you by your health care provider. Make sure you discuss any questions you have with your health care provider. Document Released: 07/09/2015 Document Revised: 03/01/2016 Document Reviewed: 04/13/2015 Elsevier Interactive Patient Education  2017 [employer] Prevention in the Home Falls can cause injuries. They can happen to people of all ages. There are many things you can do to make your home safe and to help prevent falls. What can I do on the outside of my home?  Regularly fix the edges of walkways and driveways and fix any cracks.  Remove anything that might make you trip as you walk through a door, such as a raised step or threshold.  Trim any bushes or trees on the path to your home.  Use bright outdoor lighting.  Clear any walking paths of anything that might make someone trip, such as rocks or tools.  Regularly check to see if handrails are loose or broken. Make sure that both sides of any steps have handrails.  Any raised decks and porches should have  guardrails on the edges.  Have any leaves, snow, or ice cleared regularly.  Use sand or salt on walking paths during winter.  Clean up any spills in your garage right away. This includes oil or grease spills. What can I do in the bathroom?  Use night lights.  Install grab bars by the toilet and in the tub and shower. Do not use towel bars as grab bars.  Use non-skid mats or decals in the tub or shower.  If you need to sit down in the shower, use a plastic, non-slip stool.  Keep the floor dry. Clean up any water that spills on the floor as soon as it happens.  Remove soap buildup in the tub or shower regularly.  Attach bath mats securely with double-sided non-slip rug tape.  Do not have throw rugs and other things on the floor that can make you trip. What can I do in the bedroom?  Use night lights.  Make sure that you have a light by your bed that is easy to reach.  Do not use any sheets or blankets that are too big for your bed. They should not hang down onto the floor.  Have a firm chair that has side arms. You  can use this for support while you get dressed.  Do not have throw rugs and other things on the floor that can make you trip. What can I do in the kitchen?  Clean up any spills right away.  Avoid walking on wet floors.  Keep items that you use a lot in easy-to-reach places.  If you need to reach something above you, use a strong step stool that has a grab bar.  Keep electrical cords out of the way.  Do not use floor polish or wax that makes floors slippery. If you must use wax, use non-skid floor wax.  Do not have throw rugs and other things on the floor that can make you trip. What can I do with my stairs?  Do not leave any items on the stairs.  Make sure that there are handrails on both sides of the stairs and use them. Fix handrails that are broken or loose. Make sure that handrails are as long as the stairways.  Check any carpeting to make sure that  it is firmly attached to the stairs. Fix any carpet that is loose or worn.  Avoid having throw rugs at the top or bottom of the stairs. If you do have throw rugs, attach them to the floor with carpet tape.  Make sure that you have a light switch at the top of the stairs and the bottom of the stairs. If you do not have them, ask someone to add them for you. What else can I do to help prevent falls?  Wear shoes that:  Do not have high heels.  Have rubber bottoms.  Are comfortable and fit you well.  Are closed at the toe. Do not wear sandals.  If you use a stepladder:  Make sure that it is fully opened. Do not climb a closed stepladder.  Make sure that both sides of the stepladder are locked into place.  Ask someone to hold it for you, if possible.  Clearly mark and make sure that you can see:  Any grab bars or handrails.  First and last steps.  Where the edge of each step is.  Use tools that help you move around (mobility aids) if they are needed. These include:  Canes.  Walkers.  Scooters.  Crutches.  Turn on the lights when you go into a dark area. Replace any light bulbs as soon as they burn out.  Set up your furniture so you have a clear path. Avoid moving your furniture around.  If any of your floors are uneven, fix them.  If there are any pets around you, be aware of where they are.  Review your medicines with your doctor. Some medicines can make you feel dizzy. This can increase your chance of falling. Ask your doctor what other things that you can do to help prevent falls. This information is not intended to replace advice given to you by your health care provider. Make sure you discuss any questions you have with your health care provider. Document Released: 04/08/2009 Document Revised: 11/18/2015 Document Reviewed: 07/17/2014 Elsevier Interactive Patient Education  2017 [employer].
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
## 2019-08-04 NOTE — Assessment & Plan Note (Addendum)
Chronic, ongoing.  Given reports of Anoro not working, will stop Anoro and try Symbicort.  CBC ordered today.  Would like to have current PFT, but given it aerosolizes, will hold off until after pandemic subsides.  May need to refer to Pulmonary in future if Symbicort does not help.  Will f/u in 4 weeks on breathing.

## 2019-08-04 NOTE — Assessment & Plan Note (Signed)
Acute, ongoing.  Given recent injury and swelling/pain, will order x-ray of foot.  Patient advised to rest, ice, and elevate.  Patient to notify if swelling persists.

## 2019-08-04 NOTE — Assessment & Plan Note (Signed)
Chronic, ongoing.  Appointment scheduled for next week with pain clinic.  Flexeril refilled today for short-term use.

## 2019-08-05 LAB — COMPREHENSIVE METABOLIC PANEL
ALT: 9 IU/L (ref 0–32)
AST: 12 IU/L (ref 0–40)
Albumin/Globulin Ratio: 1.8 (ref 1.2–2.2)
Albumin: 4.1 g/dL (ref 3.8–4.8)
Alkaline Phosphatase: 69 IU/L (ref 39–117)
BUN/Creatinine Ratio: 20 (ref 9–23)
BUN: 11 mg/dL (ref 6–24)
Bilirubin Total: 0.3 mg/dL (ref 0.0–1.2)
CO2: 24 mmol/L (ref 20–29)
Calcium: 9 mg/dL (ref 8.7–10.2)
Chloride: 107 mmol/L — ABNORMAL HIGH (ref 96–106)
Creatinine, Ser: 0.55 mg/dL — ABNORMAL LOW (ref 0.57–1.00)
GFR calc Af Amer: 129 mL/min/{1.73_m2} (ref >59)
GFR calc non Af Amer: 112 mL/min/{1.73_m2} (ref >59)
Globulin, Total: 2.3 g/dL (ref 1.5–4.5)
Glucose: 83 mg/dL (ref 65–99)
Potassium: 3.9 mmol/L (ref 3.5–5.2)
Sodium: 142 mmol/L (ref 134–144)
Total Protein: 6.4 g/dL (ref 6.0–8.5)

## 2019-08-05 LAB — CBC WITH DIFFERENTIAL/PLATELET
Basophils Absolute: 0 10*3/uL (ref 0.0–0.2)
Basos: 1 %
EOS (ABSOLUTE): 0.2 10*3/uL (ref 0.0–0.4)
Eos: 4 %
Hematocrit: 35.8 % (ref 34.0–46.6)
Hemoglobin: 11.8 g/dL (ref 11.1–15.9)
Immature Grans (Abs): 0 10*3/uL (ref 0.0–0.1)
Immature Granulocytes: 0 %
Lymphocytes Absolute: 1.4 10*3/uL (ref 0.7–3.1)
Lymphs: 26 %
MCH: 26.2 pg — ABNORMAL LOW (ref 26.6–33.0)
MCHC: 33 g/dL (ref 31.5–35.7)
MCV: 80 fL (ref 79–97)
Monocytes Absolute: 0.4 10*3/uL (ref 0.1–0.9)
Monocytes: 8 %
Neutrophils Absolute: 3.3 10*3/uL (ref 1.4–7.0)
Neutrophils: 61 %
Platelets: 270 10*3/uL (ref 150–450)
RBC: 4.5 x10E6/uL (ref 3.77–5.28)
RDW: 14 % (ref 11.7–15.4)
WBC: 5.3 10*3/uL (ref 3.4–10.8)

## 2019-08-05 LAB — LIPID PANEL W/O CHOL/HDL RATIO
Cholesterol, Total: 169 mg/dL (ref 100–199)
HDL: 50 mg/dL (ref >39)
LDL Chol Calc (NIH): 108 mg/dL — ABNORMAL HIGH (ref 0–99)
Triglycerides: 56 mg/dL (ref 0–149)
VLDL Cholesterol Cal: 11 mg/dL (ref 5–40)

## 2019-08-05 LAB — TSH: TSH: 1.43 u[IU]/mL (ref 0.450–4.500)

## 2019-08-07 ENCOUNTER — Telehealth: Payer: Self-pay | Admitting: *Deleted

## 2019-08-07 NOTE — Telephone Encounter (Signed)
Attempted to call for new patient assessment. Message left. 

## 2019-08-07 NOTE — Progress Notes (Signed)
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   . Leg cramps   . Medial meniscus tear   . Osteoporosis   . Personality disorder ([place])   . PONV (postoperative nausea and vomiting)   . Schizophrenia ([place])   . Torn ACL    Assessment  Primary Diagnosis & Pertinent Problem List: The primary encounter diagnosis was Chronic knee pain (Primary Area of Pain) (Left). 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  Visit Diagnosis (New problems to examiner): 1. Chronic knee pain (Primary Area of Pain) (Left)   2. Anterior cruciate ligament  disruption, sequela (Left)   3. 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 Degenerative tear of posterior horn of medial meniscus (Left)   5. Osteoarthritis of knee (Left)   6. Chronic pain syndrome    Plan of Care (Initial workup plan)  Note:  Ms. Mondesir was reminded that as per protocol, today's visit has been an evaluation only. We have not taken over the patient's controlled substance management.  Problem-specific plan: No problem-specific Assessment & Plan notes found for this encounter.  Lab Orders  No laboratory test(s) ordered today   Imaging Orders  No imaging studies ordered today   Referral Orders  No referral(s) requested today   Procedure Orders    No procedure(s) ordered today   Pharmacotherapy (current): Medications ordered:  No orders of the defined types were placed in this encounter.    Pharmacological management options:  Opioid Analgesics: I will not be taking her as the patient.  Membrane stabilizer: I will not be taking over Ms. Churilla medication regimen  Muscle relaxant: I will not be taking over Ms. Raymond medication regimen  NSAID: I will not be taking over Ms. Boies medication regimen  Other analgesic(s): I will not be taking over Ms. Lormand medication regimen   Interventional management options: Based on the exchange that we have had in the last 2 encounters, I will not be taking her as Sandra patient or accepting to evaluate her again. Procedure(s) under consideration:  None   Provider-requested follow-up: I will not be taking her as Sandra patient.  Future Appointments  Date Time Provider [place]  08/18/2019  1:30 PM [facility] MM 3 [facility]  09/08/2019  3:00 PM Park Liter P, DO [facility]  08/09/2020 10:15 AM CFP NURSE HEALTH ADVISOR [facility]   Total duration of encounter: 15 minutes.  Primary Care Physician: Valerie Roys, DO Note by: Sandra Cola, MD Date: 08/11/2019; Time: 12:10 PM

## 2019-08-11 ENCOUNTER — Ambulatory Visit: Payer: Medicare Other | Attending: Pain Medicine | Admitting: Pain Medicine

## 2019-08-11 ENCOUNTER — Other Ambulatory Visit: Payer: Self-pay

## 2019-08-11 DIAGNOSIS — M238X2 Other internal derangements of left knee: Secondary | ICD-10-CM

## 2019-08-11 DIAGNOSIS — M25562 Pain in left knee: Secondary | ICD-10-CM | POA: Diagnosis not present

## 2019-08-11 DIAGNOSIS — G894 Chronic pain syndrome: Secondary | ICD-10-CM

## 2019-08-11 DIAGNOSIS — G8929 Other chronic pain: Secondary | ICD-10-CM

## 2019-08-11 DIAGNOSIS — S83512S Sprain of anterior cruciate ligament of left knee, sequela: Secondary | ICD-10-CM

## 2019-08-11 DIAGNOSIS — M23322 Other meniscus derangements, posterior horn of medial meniscus, left knee: Secondary | ICD-10-CM | POA: Diagnosis not present

## 2019-08-11 DIAGNOSIS — M1712 Unilateral primary osteoarthritis, left knee: Secondary | ICD-10-CM

## 2019-08-12 NOTE — Progress Notes (Signed)
completed

## 2019-08-18 ENCOUNTER — Other Ambulatory Visit: Payer: Self-pay

## 2019-08-18 ENCOUNTER — Ambulatory Visit
Admission: RE | Admit: 2019-08-18 | Discharge: 2019-08-18 | Disposition: A | Discharge status: Home / Self Care | Payer: Medicare Other | Source: Ambulatory Visit | Attending: Nurse Practitioner | Admitting: Nurse Practitioner

## 2019-08-18 DIAGNOSIS — Z1231 Encounter for screening mammogram for malignant neoplasm of breast: Secondary | ICD-10-CM | POA: Diagnosis not present

## 2019-08-20 ENCOUNTER — Telehealth: Payer: Self-pay

## 2019-08-20 NOTE — Telephone Encounter (Signed)
Called and spoke with patient, would like a prescription for gabapentin, patient will discuss at her follow up appt.  Copied from CRM [id number]. Topic: General - Call Back - No Documentation >> Aug 19, 2019  4:40 PM Randol Kern wrote: Reason for CRM: Pt is requesting a call back from Clinic Best contact: [phone]

## 2019-08-25 ENCOUNTER — Telehealth: Payer: Self-pay | Admitting: Family Medicine

## 2019-08-25 NOTE — Telephone Encounter (Signed)
Please redirect referral elsewhere per below

## 2019-08-25 NOTE — Telephone Encounter (Signed)
Copied from CRM [id number]. Topic: Referral - Request for Referral >> Aug 25, 2019 10:35 AM Randol Kern wrote: Has patient seen PCP for this complaint? Yes.   *If NO, is insurance requiring patient see PCP for this issue before PCP can refer them? Referral for which specialty: Pain Management  [facility] provider[facility]office[facility]  Reason for referral: New referral request, terrified of needles does not want to go to the [facility]

## 2019-08-27 NOTE — Telephone Encounter (Signed)
Patient requesting a callback regarding

## 2019-08-28 ENCOUNTER — Other Ambulatory Visit: Payer: Self-pay

## 2019-08-28 DIAGNOSIS — F3160 Bipolar disorder, current episode mixed, unspecified: Secondary | ICD-10-CM

## 2019-08-28 MED ORDER — FLUOXETINE HCL 20 MG PO CAPS: 20.0000 mg | ORAL_CAPSULE | Freq: Every day | ORAL | 2 refills | Status: DC

## 2019-08-28 NOTE — Telephone Encounter (Signed)
Class was still print.  Fixed Rx to be Normal so it can sent electronically.  Please resign.

## 2019-08-28 NOTE — Telephone Encounter (Signed)
Refill request for Fluoxetine. The last Rx class was Print and wasn't sent in electronically. Please resend. LOV: 08/04/2019 Next Appt: 09/08/2019  Routing to provider who last wrote Rx.

## 2019-08-28 NOTE — Addendum Note (Signed)
Addended by: Marshall Cork on: 08/28/2019 10:43 AM   Modules accepted: Orders

## 2019-09-08 ENCOUNTER — Ambulatory Visit: Payer: Medicare Other | Admitting: Family Medicine

## 2019-09-12 NOTE — Telephone Encounter (Signed)
Patient calling back in to follow up on referral.

## 2019-09-12 NOTE — Telephone Encounter (Signed)
She would ike new referral sent to [facility] in [place] / please advise

## 2019-09-16 ENCOUNTER — Other Ambulatory Visit: Payer: Self-pay

## 2019-09-16 DIAGNOSIS — M25562 Pain in left knee: Secondary | ICD-10-CM

## 2019-09-16 DIAGNOSIS — G8929 Other chronic pain: Secondary | ICD-10-CM

## 2019-09-16 MED ORDER — CYCLOBENZAPRINE HCL 10 MG PO TABS: 5.0000 mg | ORAL_TABLET | Freq: Three times a day (TID) | ORAL | 0 refills | Status: DC | PRN

## 2019-09-16 MED ORDER — ALBUTEROL SULFATE HFA 108 (90 BASE) MCG/ACT IN AERS: 2.0000 | INHALATION_SPRAY | Freq: Four times a day (QID) | RESPIRATORY_TRACT | 0 refills | Status: DC | PRN

## 2019-09-16 MED ORDER — MELOXICAM 7.5 MG PO TABS: 7.5000 mg | ORAL_TABLET | Freq: Every day | ORAL | 0 refills | Status: DC

## 2019-09-16 NOTE — Telephone Encounter (Addendum)
Refill request for Albuterol (Ventolin HFA) inhaler, Meloxicam, Cyclobenazaprine,  LOV: 08/04/2019 Next Appt: none

## 2019-09-16 NOTE — Telephone Encounter (Signed)
Pt scheduled for 09/30/19

## 2019-09-16 NOTE — Telephone Encounter (Signed)
Needs follow-up

## 2019-09-18 ENCOUNTER — Other Ambulatory Visit: Payer: Self-pay

## 2019-09-18 ENCOUNTER — Ambulatory Visit (INDEPENDENT_AMBULATORY_CARE_PROVIDER_SITE_OTHER): Payer: Medicare Other | Admitting: Psychiatry

## 2019-09-18 ENCOUNTER — Encounter (HOSPITAL_COMMUNITY): Payer: Self-pay | Admitting: Psychiatry

## 2019-09-18 DIAGNOSIS — F319 Bipolar disorder, unspecified: Secondary | ICD-10-CM | POA: Diagnosis not present

## 2019-09-18 DIAGNOSIS — F609 Personality disorder, unspecified: Secondary | ICD-10-CM

## 2019-09-18 DIAGNOSIS — F4310 Post-traumatic stress disorder, unspecified: Secondary | ICD-10-CM | POA: Diagnosis not present

## 2019-09-18 MED ORDER — LITHIUM CARBONATE ER 300 MG PO TBCR: 300.0000 mg | EXTENDED_RELEASE_TABLET | Freq: Two times a day (BID) | ORAL | 1 refills | Status: DC
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
## 2019-09-30 ENCOUNTER — Ambulatory Visit: Payer: Medicare Other | Admitting: Nurse Practitioner

## 2019-10-09 ENCOUNTER — Encounter (HOSPITAL_COMMUNITY): Payer: Self-pay | Admitting: Psychiatry

## 2019-10-09 ENCOUNTER — Ambulatory Visit (INDEPENDENT_AMBULATORY_CARE_PROVIDER_SITE_OTHER): Payer: Medicare Other | Admitting: Psychiatry

## 2019-10-09 ENCOUNTER — Other Ambulatory Visit: Payer: Self-pay

## 2019-10-09 DIAGNOSIS — F609 Personality disorder, unspecified: Secondary | ICD-10-CM | POA: Diagnosis not present

## 2019-10-09 DIAGNOSIS — F319 Bipolar disorder, unspecified: Secondary | ICD-10-CM

## 2019-10-09 DIAGNOSIS — F4310 Post-traumatic stress disorder, unspecified: Secondary | ICD-10-CM

## 2019-10-09 MED ORDER — LITHIUM CARBONATE ER 450 MG PO TBCR: 450.0000 mg | EXTENDED_RELEASE_TABLET | Freq: Two times a day (BID) | ORAL | 1 refills | Status: DC

## 2019-10-09 MED ORDER — CLONAZEPAM 0.5 MG PO TABS: 0.5000 mg | ORAL_TABLET | Freq: Every day | ORAL | 0 refills | Status: DC | PRN

## 2019-10-09 NOTE — Progress Notes (Signed)
Virtual Visit via Telephone Note  I connected with Sandra Jensen on 10/09/19 at  2:20 PM EDT by telephone and verified that I am speaking with the correct person using two identifiers.   I discussed the limitations, risks, security and privacy concerns of performing an evaluation and management service by telephone and the availability of in person appointments. I also discussed with the patient that there may be a patient responsible charge related to this service. The patient expressed understanding and agreed to proceed.   History of Present Illness: Patient is evaluated by phone session.  She is a 49 year old Caucasian divorced female who was seen first time 4 weeks ago as she was referred from PCP for the management of psychiatric symptoms.  She has history of bipolar disorder and PTSD.  We started her on Lamictal and she continued on Prozac prescribed by PCP.  She noticed some improvement in her mood and anger but is still have a lot of anxiety, irritability and mood swings.  She is having nightmares and flashback.  She endorsed hearing voices of children and movements were crying and screaming.  She reported multiple personality and endorsed there is a person named Olvera lives in her.  She admitted having paranoia and trust issues.  She is not able to schedule appointment with a therapist.  She is also concerned about her daughter who is getting divorce.  Her daughter lives in [place].  Patient lives with her sister.  She reported a good relationship with the sister.  Her appetite is okay.  Energy level is fair.  She is not drinking or using any illegal substances.  Patient has chronic health issues including pain, osteoarthritis.  Past Psychiatric History: H/O paranoia, homicidal thoughts towards cousin, poor impulse control hallucination, multiple personality, PTSD and anxiety.  Multiple inpatient at [facility]  Last inpatient 2019.  Saw Dr. Warnell Forester and Dr. Martie Round at [facility].  H/O not consistent with medication.  Took Abilify (angry), Seroquel, Lamictal, Adderall, klonopin, lithium, Lexapro and Prolixin.  H/O cocaine in 2019.   Psychiatric Specialty Exam: Physical Exam  Review of Systems  There were no vitals taken for this visit.There is no height or weight on file to calculate BMI.  General Appearance: NA  Eye Contact:  NA  Speech:  Normal Rate  Volume:  Normal  Mood:  Anxious and Dysphoric  Affect:  NA  Thought Process:  Descriptions of Associations: Intact  Orientation:  Full (Time, Place, and Person)  Thought Content:  Hallucinations: Auditory hear children and women screaming and Paranoid Ideation  Suicidal Thoughts:  No  Homicidal Thoughts:  No  Memory:  Immediate;   Fair Recent;   Fair Remote;   Fair  Judgement:  Fair  Insight:  Fair  Psychomotor Activity:  NA  Concentration:  Concentration: Fair and Attention Span: Fair  Recall:  [employer] of Knowledge:  Good  Language:  Fair  Akathisia:  No  Handed:  Right  AIMS (if indicated):     Assets:  Communication Skills Desire for Improvement Housing Resilience Social Support  ADL's:  Intact  Cognition:  WNL  Sleep:   ok      Assessment and Plan: Bipolar disorder type I.  PTSD.  Personality disorder NOS.  Patient is taking lithium 300 mg twice a day and noticed some improvement.  However she is still have irritability, hallucination, paranoia and trust issues.  She is anxious because of daughter getting divorced.  She recall had a  good response with Klonopin.  We will try Klonopin 0.5 mg to help with anxiety however I recommend not to take every day to avoid dependency.  We will provide 20 tablets.  Reinforced that she need to schedule appointment for therapy.  We will also increase lithium 450 mg twice a day to help her residual mood lability and continue Prozac 20 mg given by PCP.  She has refill remaining on her Prozac.  Recommended to call us back if she is any question of  any concern.  Follow-up in 6 weeks.  Time spent 25 minutes.  Follow Up Instructions:    I discussed the assessment and treatment plan with the patient. The patient was provided an opportunity to ask questions and all were answered. The patient agreed with the plan and demonstrated an understanding of the instructions.   The patient was advised to call back or seek an in-person evaluation if the symptoms worsen or if the condition fails to improve as anticipated.  I provided 25 minutes of non-face-to-face time during this encounter.   Cleotis Nipper, MD

## 2019-10-20 ENCOUNTER — Ambulatory Visit (HOSPITAL_COMMUNITY): Payer: Medicaid Other | Admitting: Psychiatry

## 2019-11-04 ENCOUNTER — Telehealth: Payer: Self-pay | Admitting: Family Medicine

## 2019-11-04 DIAGNOSIS — M170 Bilateral primary osteoarthritis of knee: Secondary | ICD-10-CM

## 2019-11-04 NOTE — Telephone Encounter (Signed)
Copied from CRM [id number]. Topic: Referral - Request for Referral >> Nov 04, 2019 11:16 AM Marylen Ponto wrote: Has patient seen PCP for this complaint? yes  *If NO, is insurance requiring patient see PCP for this issue before PCP can refer them? Referral for which specialty: Pain Clinic Preferred provider/office: [facility] in [facility] Reason for referral: leg pain  Pt sister Armando Reichert requests that the referral be sent to the attention: Marchelle Folks and Dr. Ellwood Sayers >> Nov 04, 2019 11:21 AM Marylen Ponto wrote: [facility] fax# [phone]

## 2019-11-04 NOTE — Telephone Encounter (Signed)
Routing to provider  

## 2019-11-04 NOTE — Telephone Encounter (Signed)
Referral placed per request.

## 2019-11-15 ENCOUNTER — Other Ambulatory Visit: Payer: Self-pay

## 2019-11-15 ENCOUNTER — Emergency Department (HOSPITAL_BASED_OUTPATIENT_CLINIC_OR_DEPARTMENT_OTHER)
Admission: EM | Admit: 2019-11-15 | Discharge: 2019-11-15 | Disposition: A | Discharge status: Home / Self Care | Payer: Medicare Other | Attending: Emergency Medicine | Admitting: Emergency Medicine

## 2019-11-15 ENCOUNTER — Encounter (HOSPITAL_BASED_OUTPATIENT_CLINIC_OR_DEPARTMENT_OTHER): Payer: Self-pay

## 2019-11-15 DIAGNOSIS — J449 Chronic obstructive pulmonary disease, unspecified: Secondary | ICD-10-CM | POA: Insufficient documentation

## 2019-11-15 DIAGNOSIS — J00 Acute nasopharyngitis [common cold]: Secondary | ICD-10-CM | POA: Diagnosis not present

## 2019-11-15 DIAGNOSIS — Z79899 Other long term (current) drug therapy: Secondary | ICD-10-CM | POA: Insufficient documentation

## 2019-11-15 DIAGNOSIS — J45909 Unspecified asthma, uncomplicated: Secondary | ICD-10-CM | POA: Diagnosis not present

## 2019-11-15 DIAGNOSIS — F17210 Nicotine dependence, cigarettes, uncomplicated: Secondary | ICD-10-CM | POA: Diagnosis not present

## 2019-11-15 DIAGNOSIS — R0981 Nasal congestion: Secondary | ICD-10-CM | POA: Diagnosis present

## 2019-11-15 MED ORDER — CETIRIZINE HCL 5 MG PO TABS: 5.0000 mg | ORAL_TABLET | Freq: Every day | ORAL | 0 refills | Status: DC

## 2019-11-15 MED ORDER — FLUTICASONE PROPIONATE 50 MCG/ACT NA SUSP: 1.0000 | Freq: Every day | NASAL | 0 refills | Status: DC

## 2019-11-15 NOTE — Discharge Instructions (Signed)
Continue using over-the-counter medications as needed. Follow up with your primary care provider if symptoms persist.

## 2019-11-15 NOTE — ED Triage Notes (Signed)
Pt c/o runny nose, congestion, post nasal drip, watery eyes since last PM.
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
## 2019-11-17 ENCOUNTER — Telehealth (INDEPENDENT_AMBULATORY_CARE_PROVIDER_SITE_OTHER): Payer: Medicare Other | Admitting: Psychiatry

## 2019-11-17 ENCOUNTER — Other Ambulatory Visit (HOSPITAL_COMMUNITY): Payer: Self-pay | Admitting: *Deleted

## 2019-11-17 ENCOUNTER — Other Ambulatory Visit: Payer: Self-pay | Admitting: Family Medicine

## 2019-11-17 ENCOUNTER — Other Ambulatory Visit (HOSPITAL_COMMUNITY): Payer: Self-pay | Admitting: Psychiatry

## 2019-11-17 ENCOUNTER — Encounter (HOSPITAL_COMMUNITY): Payer: Self-pay | Admitting: Psychiatry

## 2019-11-17 ENCOUNTER — Other Ambulatory Visit: Payer: Self-pay

## 2019-11-17 DIAGNOSIS — F319 Bipolar disorder, unspecified: Secondary | ICD-10-CM

## 2019-11-17 DIAGNOSIS — G8929 Other chronic pain: Secondary | ICD-10-CM

## 2019-11-17 DIAGNOSIS — F4310 Post-traumatic stress disorder, unspecified: Secondary | ICD-10-CM

## 2019-11-17 DIAGNOSIS — F3160 Bipolar disorder, current episode mixed, unspecified: Secondary | ICD-10-CM

## 2019-11-17 DIAGNOSIS — F609 Personality disorder, unspecified: Secondary | ICD-10-CM | POA: Diagnosis not present

## 2019-11-17 DIAGNOSIS — Z79899 Other long term (current) drug therapy: Secondary | ICD-10-CM

## 2019-11-17 MED ORDER — CLONAZEPAM 0.5 MG PO TABS: 0.5000 mg | ORAL_TABLET | Freq: Every day | ORAL | 1 refills | Status: DC | PRN

## 2019-11-17 MED ORDER — LITHIUM CARBONATE ER 450 MG PO TBCR: 450.0000 mg | EXTENDED_RELEASE_TABLET | Freq: Two times a day (BID) | ORAL | 1 refills | Status: DC

## 2019-11-17 MED ORDER — FLUOXETINE HCL 10 MG PO CAPS: 30.0000 mg | ORAL_CAPSULE | Freq: Every day | ORAL | 1 refills | Status: DC

## 2019-11-17 NOTE — Telephone Encounter (Signed)
Requested medication (s) are due for refill today: yes  Requested medication (s) are on the active medication list:yes  Last refill:  09/16/19  Future visit scheduled: yes  08/09/20  Notes to clinic:  Not delegated    Requested Prescriptions  Pending Prescriptions Disp Refills   cyclobenzaprine (FLEXERIL) 10 MG tablet [Pharmacy Med Name: CYCLOBENZAPRINE HCL 10 MG TAB] 30 tablet 0    Sig: TAKE 1/2 TABLET BY MOUTH 3 TIMES DAILY AS NEEDED FOR MSUCLE SPASMS      Not Delegated - Analgesics:  Muscle Relaxants Failed - 11/17/2019 12:22 PM      Failed - This refill cannot be delegated      Passed - Valid encounter within last 6 months    Recent Outpatient Visits           3 months ago Chronic pain of left knee   [facility] Mardene Celeste I, NP   3 months ago Chronic knee pain (Primary Area of Pain) (Left)   [facility] Mardene Celeste I, NP   1 year ago Medicare annual wellness visit, subsequent   [facility] [place], [place], DO   1 year ago Bipolar affective disorder, current episode mixed, current episode severity unspecified (HCC)   [facility] Johnson, Megan P, DO   2 years ago Bipolar affective disorder, current episode mixed, current episode severity unspecified (HCC)   [facility] Johnson, Megan P, DO       Future Appointments             In 8 months [facility], [facility]

## 2019-11-17 NOTE — Telephone Encounter (Signed)
Routing to provider  
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
## 2019-12-02 ENCOUNTER — Telehealth (HOSPITAL_COMMUNITY): Payer: Self-pay | Admitting: *Deleted

## 2019-12-02 NOTE — Telephone Encounter (Signed)
Pt called c/o insomnia and wanting something to help with that as well as increasing the Klonopin to help with increased anxiety. Pt has an appointment on 01/19/20. Please review and advise.

## 2019-12-04 NOTE — Telephone Encounter (Signed)
She can try trazodone 50 mg at bedtime.  We need a lithium level which was ordered on the last visit.  He is reminded to had lithium level done.  She should not take Klonopin and trazodone together.

## 2019-12-05 ENCOUNTER — Other Ambulatory Visit (HOSPITAL_COMMUNITY): Payer: Self-pay | Admitting: *Deleted

## 2019-12-05 MED ORDER — TRAZODONE HCL 50 MG PO TABS: 50.0000 mg | ORAL_TABLET | Freq: Every day | ORAL | 2 refills | Status: DC

## 2019-12-22 ENCOUNTER — Other Ambulatory Visit: Payer: Self-pay | Admitting: Family Medicine

## 2019-12-22 ENCOUNTER — Ambulatory Visit: Payer: Medicare Other | Admitting: Nurse Practitioner

## 2019-12-22 DIAGNOSIS — M25552 Pain in left hip: Secondary | ICD-10-CM | POA: Diagnosis not present

## 2019-12-22 DIAGNOSIS — M25562 Pain in left knee: Secondary | ICD-10-CM

## 2019-12-22 DIAGNOSIS — R5383 Other fatigue: Secondary | ICD-10-CM | POA: Diagnosis not present

## 2019-12-22 DIAGNOSIS — D539 Nutritional anemia, unspecified: Secondary | ICD-10-CM | POA: Diagnosis not present

## 2019-12-22 DIAGNOSIS — M129 Arthropathy, unspecified: Secondary | ICD-10-CM | POA: Diagnosis not present

## 2019-12-22 DIAGNOSIS — Z79899 Other long term (current) drug therapy: Secondary | ICD-10-CM | POA: Diagnosis not present

## 2019-12-22 DIAGNOSIS — R300 Dysuria: Secondary | ICD-10-CM | POA: Diagnosis not present

## 2019-12-22 DIAGNOSIS — E559 Vitamin D deficiency, unspecified: Secondary | ICD-10-CM | POA: Diagnosis not present

## 2019-12-22 DIAGNOSIS — E7800 Pure hypercholesterolemia, unspecified: Secondary | ICD-10-CM | POA: Diagnosis not present

## 2019-12-22 DIAGNOSIS — G8929 Other chronic pain: Secondary | ICD-10-CM

## 2019-12-22 DIAGNOSIS — Z780 Asymptomatic menopausal state: Secondary | ICD-10-CM | POA: Diagnosis not present

## 2020-01-05 ENCOUNTER — Encounter: Payer: Self-pay | Admitting: Nurse Practitioner

## 2020-01-05 ENCOUNTER — Ambulatory Visit (INDEPENDENT_AMBULATORY_CARE_PROVIDER_SITE_OTHER): Payer: Medicare Other | Admitting: Nurse Practitioner

## 2020-01-05 ENCOUNTER — Ambulatory Visit: Payer: Medicare Other | Admitting: Nurse Practitioner

## 2020-01-05 DIAGNOSIS — J440 Chronic obstructive pulmonary disease with acute lower respiratory infection: Secondary | ICD-10-CM

## 2020-01-05 DIAGNOSIS — F3160 Bipolar disorder, current episode mixed, unspecified: Secondary | ICD-10-CM

## 2020-01-05 DIAGNOSIS — M25552 Pain in left hip: Secondary | ICD-10-CM | POA: Diagnosis not present

## 2020-01-05 DIAGNOSIS — G2581 Restless legs syndrome: Secondary | ICD-10-CM | POA: Diagnosis not present

## 2020-01-05 DIAGNOSIS — J209 Acute bronchitis, unspecified: Secondary | ICD-10-CM

## 2020-01-05 DIAGNOSIS — M25562 Pain in left knee: Secondary | ICD-10-CM | POA: Diagnosis not present

## 2020-01-05 DIAGNOSIS — J309 Allergic rhinitis, unspecified: Secondary | ICD-10-CM | POA: Diagnosis not present

## 2020-01-05 DIAGNOSIS — Z79899 Other long term (current) drug therapy: Secondary | ICD-10-CM | POA: Diagnosis not present

## 2020-01-05 DIAGNOSIS — G8929 Other chronic pain: Secondary | ICD-10-CM

## 2020-01-05 DIAGNOSIS — F339 Major depressive disorder, recurrent, unspecified: Secondary | ICD-10-CM

## 2020-01-05 DIAGNOSIS — F4310 Post-traumatic stress disorder, unspecified: Secondary | ICD-10-CM | POA: Diagnosis not present

## 2020-01-05 MED ORDER — CYCLOBENZAPRINE HCL 10 MG PO TABS: 10.0000 mg | ORAL_TABLET | Freq: Every day | ORAL | 0 refills | Status: DC | PRN

## 2020-01-05 MED ORDER — ALBUTEROL SULFATE HFA 108 (90 BASE) MCG/ACT IN AERS: 1.0000 | INHALATION_SPRAY | Freq: Four times a day (QID) | RESPIRATORY_TRACT | 1 refills | Status: DC | PRN

## 2020-01-05 MED ORDER — CETIRIZINE HCL 5 MG PO TABS: 5.0000 mg | ORAL_TABLET | Freq: Every day | ORAL | 0 refills | Status: DC

## 2020-01-05 MED ORDER — FLUTICASONE PROPIONATE 50 MCG/ACT NA SUSP: 1.0000 | Freq: Every day | NASAL | 1 refills | Status: AC

## 2020-01-05 MED ORDER — FLUTICASONE PROPIONATE 50 MCG/ACT NA SUSP: 1.0000 | Freq: Every day | NASAL | 1 refills | Status: DC

## 2020-01-05 MED ORDER — BUDESONIDE-FORMOTEROL FUMARATE 80-4.5 MCG/ACT IN AERO: 2.0000 | INHALATION_SPRAY | Freq: Two times a day (BID) | RESPIRATORY_TRACT | 2 refills | Status: DC

## 2020-01-05 MED ORDER — MELOXICAM 7.5 MG PO TABS: 7.5000 mg | ORAL_TABLET | Freq: Every day | ORAL | 0 refills | Status: DC | PRN

## 2020-01-05 NOTE — Assessment & Plan Note (Signed)
Chronic, stable.  Follows with Psychiatry for mental health needs, continue to follow with them.   

## 2020-01-05 NOTE — Progress Notes (Signed)
There were no vitals taken for this visit.   Subjective:    Patient ID: Sandra Jensen, female    DOB: 09-27-1970, 49 y.o.   MRN: [id number]  HPI: Sandra Jensen is a 49 y.o. female presenting for follow up.  Chief Complaint  Patient presents with  . Depression  . Follow-up   MOOD Reports she is seeing a Psychiatrist for bipolar disorder, major depressive disorder, and PTSD and reports it is going okay.  The Psychiatrist prescribes clonazepam, fluoxetine, lithium carbonate, and trazodone.  Next appointment is the 26th.  Duration:better Anxious mood: no  Excessive worrying: no Irritability: no  Sweating: no Nausea: no Palpitations:no Hyperventilation: no Panic attacks: no Agoraphobia: no  Obscessions/compulsions: no Depressed mood: yes Depression screen [facility] 2/9 01/05/2020 08/04/2019 07/21/2019 06/07/2018 06/07/2018  Decreased Interest 1 1 2 1 1   Down, Depressed, Hopeless 1 3 3 2 2   PHQ - 2 Score 2 4 5 3 3   Altered sleeping 1 1 2 1 1   Tired, decreased energy 1 1 3 2 2   Change in appetite 0 3 2 2 2   Feeling bad or failure about yourself  0 1 3 3 3   Trouble concentrating 1 1 3 3 3   Moving slowly or fidgety/restless 0 0 2 1 1   Suicidal thoughts 0 0 2 0 0  PHQ-9 Score 5 11 22 15 15   Difficult doing work/chores Somewhat difficult Somewhat difficult Very difficult - Very difficult   Anhedonia: yes - improving Weight changes: no Insomnia: yes  - improving Hypersomnia: no Fatigue/loss of energy: yes Feelings of worthlessness: no Feelings of guilt: no Impaired concentration/indecisiveness: no Suicidal ideations: no  Crying spells: no Recent Stressors/Life Changes: no   Relationship problems: no   Family stress: no     Financial stress: no    Job stress: no    Recent death/loss: no  KNEE PAIN Currently stable with Meloxicam and flexeril.  Follows with [facility] for pain management, continue to follow with them. Duration: chronic Involved knee:  left Mechanism of injury: unknown Location:diffuse Onset: constant Severity: severe  Quality:  Sharp and shooting Frequency: constant Radiation: yes Aggravating factors: weight bearing and walking   Relief with NSAIDs?:  significant Weakness with weight bearing or walking: yes Sensation of giving way: yes Locking: yes Popping: yes Bruising: no Swelling: yes Redness: no Paresthesias/decreased sensation: no Fevers: no  COPD Reports she is doing well on the Symbicort and feels it is working better than the Anoro.   COPD status: controlled Satisfied with current treatment?: yes Oxygen use: no Dyspnea frequency: rarely Cough frequency: rarely Rescue inhaler frequency: rarely   Limitation of activity: no Productive cough: no Last Spirometry: not done Pneumovax: Up to Date Influenza: Up to Date  ALLERGIES Currently taking flonase and cetirizine for allergic rhinitis.  Reports doing well with these medications.  Allergies  Allergen Reactions  . Morphine And Related Itching  . Acetaminophen Diarrhea    Other reaction(s): Nausea Only  . Cephalexin Itching and Nausea And Vomiting    Other reaction(s): Itching  . Vicodin [Hydrocodone-Acetaminophen] Nausea And Vomiting   Outpatient Encounter Medications as of 01/05/2020  Medication Sig  . albuterol (VENTOLIN HFA) 108 (90 Base) MCG/ACT inhaler Inhale 1-2 puffs into the lungs every 6 (six) hours as needed for wheezing or shortness of breath.  . budesonide-formoterol (SYMBICORT) 80-4.5 MCG/ACT inhaler Inhale 2 puffs into the lungs 2 (two) times daily.  . cetirizine (ZYRTEC) 5 MG tablet Take 1 tablet (5 mg  total) by mouth daily.  . clonazePAM (KLONOPIN) 0.5 MG tablet Take 1 tablet (0.5 mg total) by mouth daily as needed for anxiety.  . cyclobenzaprine (FLEXERIL) 10 MG tablet Take 1 tablet (10 mg total) by mouth daily as needed for muscle spasms.  Marland Kitchen FLUoxetine (PROZAC) 10 MG capsule Take 3 capsules (30 mg total) by mouth daily.  .  fluticasone (FLONASE) 50 MCG/ACT nasal spray Place 1 spray into both nostrils daily.  Marland Kitchen lithium carbonate (ESKALITH) 450 MG CR tablet Take 1 tablet (450 mg total) by mouth 2 (two) times daily.  . meloxicam (MOBIC) 7.5 MG tablet Take 1 tablet (7.5 mg total) by mouth daily as needed for pain.  . traZODone (DESYREL) 50 MG tablet Take 1 tablet (50 mg total) by mouth at bedtime.  . [DISCONTINUED] albuterol (VENTOLIN HFA) 108 (90 Base) MCG/ACT inhaler INHALE 2 PUFFS BY MOUTH INTO THE LUNGS EVERY 6 HOURS AS NEEDED FOR WHEEZING OR SHORTNESS OF BREATH  . [DISCONTINUED] budesonide-formoterol (SYMBICORT) 80-4.5 MCG/ACT inhaler Inhale 2 puffs into the lungs 2 (two) times daily.  . [DISCONTINUED] cetirizine (ZYRTEC) 5 MG tablet Take 1 tablet (5 mg total) by mouth daily.  . [DISCONTINUED] cetirizine (ZYRTEC) 5 MG tablet Take 1 tablet (5 mg total) by mouth daily.  . [DISCONTINUED] cyclobenzaprine (FLEXERIL) 10 MG tablet TAKE 1/2 TABLET BY MOUTH 3 TIMES DAILY AS NEEDED FOR MSUCLE SPASMS  . [DISCONTINUED] fluticasone (FLONASE) 50 MCG/ACT nasal spray Place 1 spray into both nostrils daily.  . [DISCONTINUED] fluticasone (FLONASE) 50 MCG/ACT nasal spray Place 1 spray into both nostrils daily.  . [DISCONTINUED] meloxicam (MOBIC) 7.5 MG tablet TAKE 1 TABLET BY MOUTH DAILY   No facility-administered encounter medications on file as of 01/05/2020.   Patient Active Problem List   Diagnosis Date Noted  . Allergic rhinitis 01/05/2020  . Acute bronchitis with COPD (HCC) 08/04/2019  . Right foot pain 08/04/2019  . Vitamin B12 deficiency 04/22/2018  . 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 Osteoarthritis of knee (Left) 04/22/2018  . Arthropathy of knee (Bilateral) (L>R) 04/22/2018  . Degenerative tear of posterior horn of medial meniscus (Left) 04/22/2018  . Anterior cruciate ligament disruption, sequela (Left) 04/22/2018  . Deficiency of posterior cruciate ligament of knee (Left) 04/22/2018  . Chronic knee pain  (Primary Area of Pain) (Left) 04/04/2018  . Chronic pain syndrome 04/04/2018  . Long term current use of opiate analgesic 04/04/2018  . Disorder of skeletal system 04/04/2018  . Major depressive disorder 03/12/2018  . Cocaine abuse with cocaine-induced mood disorder (HCC) 09/30/2017  . 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 Bipolar disorder (HCC) 06/05/2014  . PTSD (post-traumatic stress disorder) 06/05/2014   Past Medical History:  Diagnosis Date  . ADHD   . Allergy   . Anxiety   . Arthritis   . Asthma   . Bipolar 1 disorder, depressed (HCC)   . COPD (chronic obstructive pulmonary disease) (HCC)   . Depression   . Heart murmur   . Kidney infection   . Leg cramps   . Medial meniscus tear    Bilateral  . Osteoporosis   . Personality disorder (HCC)   . PONV (postoperative nausea and vomiting)   . PTSD (post-traumatic stress disorder)   . PTSD (post-traumatic stress disorder)   . Schizophrenia (HCC)   . Torn ACL    Right and left   Relevant past medical, surgical, family and social history reviewed and updated as indicated. Interim medical history since our last visit reviewed.  Review of Systems  Constitutional: Negative.  Negative for activity change, fatigue and fever.  Respiratory: Negative.   Cardiovascular: Negative.   Musculoskeletal: Positive for arthralgias (knee pain). Negative for myalgias.  Skin: Negative.   Psychiatric/Behavioral: Negative.  Negative for behavioral problems and confusion.    Per HPI unless specifically indicated above     Objective:    There were no vitals taken for this visit.  Wt Readings from Last 3 Encounters:  11/15/19 190 lb (86.2 kg)  08/04/19 183 lb 3.2 oz (83.1 kg)  07/21/19 185 lb 3.2 oz (84 kg)    Physical Exam Physical examination unable to be performed due to lack of equipment.     Assessment & Plan:   Problem List Items Addressed This Visit      Respiratory   Acute bronchitis with COPD (HCC) - Primary    Chronic, stable on  Symbicort, will continue that and albuterol as needed.  Spirometry at next visit.      Relevant Medications   budesonide-formoterol (SYMBICORT) 80-4.5 MCG/ACT inhaler   albuterol (VENTOLIN HFA) 108 (90 Base) MCG/ACT inhaler   cetirizine (ZYRTEC) 5 MG tablet   fluticasone (FLONASE) 50 MCG/ACT nasal spray   Allergic rhinitis    Chronic, ongoing.  Will send in refill of cetirizine and flonase.  Return to clinic if symptoms persist or do not improve.        Other   Chronic knee pain (Primary Area of Pain) (Left) (Chronic)    Chronic, ongoing.  Continue to follow with pain management.  Refills for meloxicam and flexeril sent in today.      Relevant Medications   meloxicam (MOBIC) 7.5 MG tablet   cyclobenzaprine (FLEXERIL) 10 MG tablet   Bipolar disorder (HCC)    Chronic, stable.  Follows with Psychiatry for mental health needs, continue to follow with them.        PTSD (post-traumatic stress disorder)    Chronic, stable.  Follows with Psychiatry for mental health needs, continue to follow with them.      Major depressive disorder    Chronic, stable.  Follows with Psychiatry for mental health needs, continue to follow with them.         Follow up plan: Return in about 1 month (around 02/05/2020) for follow up, fasting labs, and spirometry.   This visit was completed via telephone due to the restrictions of the COVID-19 pandemic. All issues as above were discussed and addressed but no physical exam was performed. If it was felt that the patient should be evaluated in the office, they were directed there. The patient verbally consented to this visit. Patient was unable to complete an audio/visual visit due to Lack of equipment. . Location of the patient: home . Location of the provider: work . Those involved with this call:  . Provider: Mardene Celeste, DNP . CMA: Wilhemena Durie, CMA . Front Desk/Registration: [facility]  . Time spent on call: 15 minutes on the phone discussing health  concerns. 30 minutes total spent in review of patient's record and preparation of their chart.  I verified patient identity using two factors (patient name and date of birth). Patient consents verbally to being seen via telemedicine visit today.

## 2020-01-05 NOTE — Assessment & Plan Note (Signed)
Chronic, ongoing.  Will send in refill of cetirizine and flonase.  Return to clinic if symptoms persist or do not improve.

## 2020-01-05 NOTE — Assessment & Plan Note (Addendum)
Chronic, ongoing.  Continue to follow with pain management.  Refills for meloxicam and flexeril sent in today.

## 2020-01-05 NOTE — Assessment & Plan Note (Signed)
Chronic, stable on Symbicort, will continue that and albuterol as needed.  Spirometry at next visit.

## 2020-01-05 NOTE — Assessment & Plan Note (Signed)
Chronic, stable.  Follows with Psychiatry for mental health needs, continue to follow with them.

## 2020-01-12 DIAGNOSIS — M25552 Pain in left hip: Secondary | ICD-10-CM | POA: Diagnosis not present

## 2020-01-12 DIAGNOSIS — Z79899 Other long term (current) drug therapy: Secondary | ICD-10-CM | POA: Diagnosis not present

## 2020-01-12 DIAGNOSIS — M25562 Pain in left knee: Secondary | ICD-10-CM | POA: Diagnosis not present

## 2020-01-12 DIAGNOSIS — G2581 Restless legs syndrome: Secondary | ICD-10-CM | POA: Diagnosis not present

## 2020-01-19 ENCOUNTER — Telehealth (INDEPENDENT_AMBULATORY_CARE_PROVIDER_SITE_OTHER): Payer: Medicare Other | Admitting: Psychiatry

## 2020-01-19 ENCOUNTER — Encounter (HOSPITAL_COMMUNITY): Payer: Self-pay | Admitting: Psychiatry

## 2020-01-19 ENCOUNTER — Other Ambulatory Visit: Payer: Self-pay

## 2020-01-19 DIAGNOSIS — F609 Personality disorder, unspecified: Secondary | ICD-10-CM | POA: Diagnosis not present

## 2020-01-19 DIAGNOSIS — F4310 Post-traumatic stress disorder, unspecified: Secondary | ICD-10-CM | POA: Diagnosis not present

## 2020-01-19 DIAGNOSIS — F319 Bipolar disorder, unspecified: Secondary | ICD-10-CM

## 2020-01-19 MED ORDER — LITHIUM CARBONATE ER 450 MG PO TBCR: 450.0000 mg | EXTENDED_RELEASE_TABLET | Freq: Two times a day (BID) | ORAL | 2 refills | Status: DC

## 2020-01-19 MED ORDER — TRAZODONE HCL 50 MG PO TABS: 50.0000 mg | ORAL_TABLET | Freq: Every day | ORAL | 2 refills | Status: DC

## 2020-01-19 MED ORDER — FLUOXETINE HCL 40 MG PO CAPS: 40.0000 mg | ORAL_CAPSULE | Freq: Every day | ORAL | 2 refills | Status: DC

## 2020-01-19 MED ORDER — CLONAZEPAM 0.5 MG PO TABS: 0.5000 mg | ORAL_TABLET | Freq: Every day | ORAL | 2 refills | Status: AC | PRN

## 2020-01-19 NOTE — Progress Notes (Signed)
Virtual Visit via Telephone Note  I connected with Sandra Jensen on 01/19/20 at  2:40 PM EDT by telephone and verified that I am speaking with the correct person using two identifiers.  Location: Patient: home Provider: home office   I discussed the limitations, risks, security and privacy concerns of performing an evaluation and management service by telephone and the availability of in person appointments. I also discussed with the patient that there may be a patient responsible charge related to this service. The patient expressed understanding and agreed to proceed.   History of Present Illness: Patient is evaluated by phone session.  She called earlier in office requesting something to help sleep.  She wanted to increase the Klonopin dose however given the history we recommend trazodone.  She reported trazodone helps the nightmares and flashback and she denies any recent PTSD symptoms.  She is sleeping better with trazodone but is still want to increase the Klonopin.  She feels nervous and anxious.  Despite reminded few times she did forgot lithium level but she had appointment for blood work next month at her PCP office.  She feels her mood swings irritability anger is much better with lithium.  She lives with her sister and she is getting along with her better.  She has multiple personality but lately she has not reported worsening of symptoms.  She has no tremors, shakes or any EPS.  She is taking Prozac, trazodone, lithium.  Her level is okay.  She admitted some weight gain but she is hoping to watch her caloric intake and lose some weight.  She has not able to find a therapist to start therapy but she is actively looking for a therapist.  Patient has multiple health issues including chronic pain and osteoarthritis.  Patient denies any mania, psychosis or any hallucination.   Past Psychiatric History: H/Oparanoia, homicidal thoughts towards cousin, poor impulse control hallucination,  multiple personality, PTSD and anxiety. Multiple inpatient at [facility]. Last inpatient 2019. Saw Dr. Ardeth Perfect and Dr. Redmond Baseman at [facility]. H/Onot consistent with medication. Took Abilify (angry),Seroquel, Lamictal,Adderall, klonopin, lithium,Lexapro and Prolixin. H/O cocaine in 2019.     Psychiatric Specialty Exam: Physical Exam  Review of Systems  Weight 198 lb (89.8 kg).There is no height or weight on file to calculate BMI.  General Appearance: NA  Eye Contact:  NA  Speech:  Slow  Volume:  Decreased  Mood:  Anxious  Affect:  NA  Thought Process:  Descriptions of Associations: Intact  Orientation:  Full (Time, Place, and Person)  Thought Content:  Hallucinations: child crying  Suicidal Thoughts:  No  Homicidal Thoughts:  No  Memory:  Immediate;   Fair Recent;   Fair Remote;   Fair  Judgement:  Fair  Insight:  Shallow  Psychomotor Activity:  NA  Concentration:  Concentration: Fair and Attention Span: Fair  Recall:  [employer] of Knowledge:  Fair  Language:  Good  Akathisia:  No  Handed:  Right  AIMS (if indicated):     Assets:  Communication Skills Desire for Improvement Housing Resilience Social Support Transportation  ADL's:  Intact  Cognition:  WNL  Sleep:   fair      Assessment and Plan: Bipolar disorder type I.  PTSD.  Personality disorder NOS.  Discussed current medication.  Reinforced to have lithium level, patient is having blood work next month and I will forward my note to her PCP for blood work.  I recommend to try Prozac 40  mg to help with [place] mood lability and depression.  We will defer increasing Klonopin dose at this time however if patient do not feel improvement with the medication adjustment of Prozac we will consider switching to amitriptyline or mirtazapine.  She agreed with the plan.  We will continue lithium 450 mg twice a day, Klonopin 0.5 mg at bedtime to help anxiety, trazodone 50 mg at bedtime and she  will try Prozac 40 mg daily.  I also encouraged that she should consider therapy and if she cannot find anyone then we can help to schedule appointment with therapy.  I will forward my note to PCP with the recommendation to have lithium levels next month when she had blood work.  Follow-up in 3 months.  Follow Up Instructions:    I discussed the assessment and treatment plan with the patient. The patient was provided an opportunity to ask questions and all were answered. The patient agreed with the plan and demonstrated an understanding of the instructions.   The patient was advised to call back or seek an in-person evaluation if the symptoms worsen or if the condition fails to improve as anticipated.  I provided 20 minutes of non-face-to-face time during this encounter.   Cleotis Nipper, MD

## 2020-02-09 ENCOUNTER — Ambulatory Visit: Payer: Medicare Other | Admitting: Nurse Practitioner

## 2020-02-10 DIAGNOSIS — M169 Osteoarthritis of hip, unspecified: Secondary | ICD-10-CM | POA: Diagnosis not present

## 2020-02-10 DIAGNOSIS — M25552 Pain in left hip: Secondary | ICD-10-CM | POA: Diagnosis not present

## 2020-02-10 DIAGNOSIS — M25562 Pain in left knee: Secondary | ICD-10-CM | POA: Diagnosis not present

## 2020-02-10 DIAGNOSIS — Z79899 Other long term (current) drug therapy: Secondary | ICD-10-CM | POA: Diagnosis not present

## 2020-02-16 ENCOUNTER — Encounter: Payer: Self-pay | Admitting: Nurse Practitioner

## 2020-02-16 ENCOUNTER — Ambulatory Visit (INDEPENDENT_AMBULATORY_CARE_PROVIDER_SITE_OTHER): Payer: Medicare Other | Admitting: Nurse Practitioner

## 2020-02-16 ENCOUNTER — Other Ambulatory Visit: Payer: Self-pay

## 2020-02-16 VITALS — BP 131/84 | HR 78 | Temp 97.7°F | Ht 65.5 in | Wt 192.8 lb

## 2020-02-16 DIAGNOSIS — Z79899 Other long term (current) drug therapy: Secondary | ICD-10-CM

## 2020-02-16 DIAGNOSIS — F3160 Bipolar disorder, current episode mixed, unspecified: Secondary | ICD-10-CM

## 2020-02-16 DIAGNOSIS — J209 Acute bronchitis, unspecified: Secondary | ICD-10-CM

## 2020-02-16 DIAGNOSIS — R194 Change in bowel habit: Secondary | ICD-10-CM

## 2020-02-16 DIAGNOSIS — W57XXXA Bitten or stung by nonvenomous insect and other nonvenomous arthropods, initial encounter: Secondary | ICD-10-CM

## 2020-02-16 DIAGNOSIS — J440 Chronic obstructive pulmonary disease with acute lower respiratory infection: Secondary | ICD-10-CM | POA: Diagnosis not present

## 2020-02-16 MED ORDER — TRIAMCINOLONE ACETONIDE 0.1 % EX OINT: 1.0000 "application " | TOPICAL_OINTMENT | Freq: Three times a day (TID) | CUTANEOUS | 0 refills | Status: DC | PRN

## 2020-02-16 NOTE — Progress Notes (Signed)
BP 131/84 (BP Location: Left Arm, Patient Position: Sitting, Cuff Size: Normal)   Pulse 78   Temp 97.7 F (36.5 C) (Tympanic)   Ht 5' 5.5" (1.664 m)   Wt 192 lb 12.8 oz (87.5 kg)   SpO2 100%   BMI 31.60 kg/m    Subjective:    Patient ID: Sandra Jensen, female    DOB: 11/12/1970, 49 y.o.   MRN: [id number]  HPI: Sandra Jensen is a 49 y.o. female presenting for follow up.  Chief Complaint  Patient presents with  . COPD  . Depression    Psychiatry needs lithium level checked   SKIN LESION Reports she was attacked by fire ants over the weekend.  Her feet are covered in bites and scabs and they are extremely itchy. Duration: days Location: tops of both feet Painful: yes Itching: yes Onset: sudden Context: not changing Associated signs and symptoms:  History of skin cancer: no History of precancerous skin lesions: no Family history of skin cancer: no   COPD Doing well with symbicort.  Not having to use rescue inhaler. COPD status: controlled Satisfied with current treatment?: yes Oxygen use: no Dyspnea frequency: with activity Cough frequency: rare Rescue inhaler frequency:  rare Limitation of activity: no Productive cough: no Last Spirometry: done today Pneumovax: Up to Date Influenza: deferred to flu season  ABDOMINAL ISSUES Patient reports she is having issues with incontinence of bowel.  Bowel movemnts have been changing, lately have been watery.  Sometimes orange-brown; sometimes is very dark, sometimes it looks like coffee grounds.  Has multiple bowel movements every day. Duration: months Nature: ache Location: epigastric  Severity: mild  Radiation: no Episode duration: changes Frequency:  Alleviating factors: Aggravating factors: Treatments attempted: pepto bismol  Constipation: no Diarrhea: no Episodes of diarrhea/day: 5-6 or more Mucous in the stool: yes Heartburn: no Bloating:yes Flatulence: no Nausea: yes Vomiting: no Episodes of  vomit/day: 0 Melena or hematochezia: no Rash: no Jaundice: no Fever: no Weight loss: no  Allergies  Allergen Reactions  . Morphine And Related Itching  . Acetaminophen Diarrhea    Other reaction(s): Nausea Only  . Cephalexin Itching and Nausea And Vomiting    Other reaction(s): Itching  . Vicodin [Hydrocodone-Acetaminophen] Nausea And Vomiting   Outpatient Encounter Medications as of 02/16/2020  Medication Sig  . albuterol (VENTOLIN HFA) 108 (90 Base) MCG/ACT inhaler Inhale 1-2 puffs into the lungs every 6 (six) hours as needed for wheezing or shortness of breath.  . budesonide-formoterol (SYMBICORT) 80-4.5 MCG/ACT inhaler Inhale 2 puffs into the lungs 2 (two) times daily.  . cetirizine (ZYRTEC) 5 MG tablet Take 1 tablet (5 mg total) by mouth daily.  . clonazePAM (KLONOPIN) 0.5 MG tablet Take 1 tablet (0.5 mg total) by mouth daily as needed for anxiety.  . cyclobenzaprine (FLEXERIL) 10 MG tablet Take 1 tablet (10 mg total) by mouth daily as needed for muscle spasms.  Marland Kitchen FLUoxetine (PROZAC) 40 MG capsule Take 1 capsule (40 mg total) by mouth daily.  . fluticasone (FLONASE) 50 MCG/ACT nasal spray Place 1 spray into both nostrils daily.  Marland Kitchen lithium carbonate (ESKALITH) 450 MG CR tablet Take 1 tablet (450 mg total) by mouth 2 (two) times daily.  . meloxicam (MOBIC) 7.5 MG tablet Take 1 tablet (7.5 mg total) by mouth daily as needed for pain.  . traZODone (DESYREL) 50 MG tablet Take 1 tablet (50 mg total) by mouth at bedtime.  . triamcinolone ointment (KENALOG) 0.1 % Apply 1 application topically 3 (  three) times daily as needed.   No facility-administered encounter medications on file as of 02/16/2020.   Patient Active Problem List   Diagnosis Date Noted  . Change in stool habits 02/17/2020  . Bug bite 02/17/2020  . Allergic rhinitis 01/05/2020  . Acute bronchitis with COPD (HCC) 08/04/2019  . Right foot pain 08/04/2019  . Vitamin B12 deficiency 04/22/2018  . 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 Osteoarthritis of knee (Left) 04/22/2018  . Arthropathy of knee (Bilateral) (L>R) 04/22/2018  . Degenerative tear of posterior horn of medial meniscus (Left) 04/22/2018  . Anterior cruciate ligament disruption, sequela (Left) 04/22/2018  . Deficiency of posterior cruciate ligament of knee (Left) 04/22/2018  . Chronic knee pain (Primary Area of Pain) (Left) 04/04/2018  . Chronic pain syndrome 04/04/2018  . Long term current use of opiate analgesic 04/04/2018  . Disorder of skeletal system 04/04/2018  . Major depressive disorder 03/12/2018  . Cocaine abuse with cocaine-induced mood disorder (HCC) 09/30/2017  . 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 Bipolar disorder (HCC) 06/05/2014  . PTSD (post-traumatic stress disorder) 06/05/2014   Past Medical History:  Diagnosis Date  . ADHD   . Allergy   . Anxiety   . Arthritis   . Asthma   . Bipolar 1 disorder, depressed (HCC)   . COPD (chronic obstructive pulmonary disease) (HCC)   . Depression   . Heart murmur   . Kidney infection   . Leg cramps   . Medial meniscus tear    Bilateral  . Osteoporosis   . Personality disorder (HCC)   . PONV (postoperative nausea and vomiting)   . PTSD (post-traumatic stress disorder)   . PTSD (post-traumatic stress disorder)   . Schizophrenia (HCC)   . Torn ACL    Right and left   Relevant past medical, surgical, family and social history reviewed and updated as indicated. Interim medical history since our last visit reviewed.  Review of Systems  Constitutional: Negative.  Negative for activity change, appetite change, fatigue and fever.  Eyes: Negative.  Negative for visual disturbance.  Respiratory: Negative.  Negative for cough, chest tightness, shortness of breath and wheezing.   Cardiovascular: Negative.  Negative for chest pain, palpitations and leg swelling.  Gastrointestinal: Positive for abdominal pain. Negative for abdominal distention, blood in stool, constipation, diarrhea,  nausea and vomiting.       + fecal incontinence  Musculoskeletal: Negative.   Skin: Positive for rash. Negative for color change, pallor and wound.  Neurological: Negative.   Psychiatric/Behavioral: Negative.     Per HPI unless specifically indicated above     Objective:    BP 131/84 (BP Location: Left Arm, Patient Position: Sitting, Cuff Size: Normal)   Pulse 78   Temp 97.7 F (36.5 C) (Tympanic)   Ht 5' 5.5" (1.664 m)   Wt 192 lb 12.8 oz (87.5 kg)   SpO2 100%   BMI 31.60 kg/m   Wt Readings from Last 3 Encounters:  02/16/20 192 lb 12.8 oz (87.5 kg)  11/15/19 190 lb (86.2 kg)  08/04/19 183 lb 3.2 oz (83.1 kg)    Physical Exam Vitals and nursing note reviewed.  Constitutional:      General: She is not in acute distress.    Appearance: Normal appearance. She is obese. She is not toxic-appearing.  HENT:     Head: Normocephalic and atraumatic.     Right Ear: External ear normal.     Left Ear: External ear normal.  Eyes:     General:  No scleral icterus.    Extraocular Movements: Extraocular movements intact.  Neck:     Vascular: No carotid bruit.  Cardiovascular:     Rate and Rhythm: Normal rate and regular rhythm.     Heart sounds: Normal heart sounds. No murmur heard.   Pulmonary:     Effort: Pulmonary effort is normal. No respiratory distress.     Breath sounds: Normal breath sounds. No wheezing, rhonchi or rales.  Abdominal:     General: Abdomen is flat. Bowel sounds are normal.     Palpations: Abdomen is soft.     Tenderness: There is no abdominal tenderness.  Musculoskeletal:        General: No swelling or tenderness. Normal range of motion.     Cervical back: Normal range of motion.  Skin:    General: Skin is warm and dry.     Capillary Refill: Capillary refill takes less than 2 seconds.     Coloration: Skin is not jaundiced.     Findings: Abrasion present. No bruising.       Neurological:     General: No focal deficit present.     Mental Status: She  is alert and oriented to person, place, and time.     Motor: No weakness.     Gait: Gait normal.  Psychiatric:        Mood and Affect: Mood normal.        Behavior: Behavior normal.        Thought Content: Thought content normal.        Judgment: Judgment normal.     Results for orders placed or performed in visit on 02/16/20  Lithium level  Result Value Ref Range   Lithium Lvl 0.9 0.5 - 1.2 mmol/L  HgB A1c  Result Value Ref Range   Hgb A1c MFr Bld 5.0 4.8 - 5.6 %   Est. average glucose Bld gHb Est-mCnc 97 mg/dL  TSH  Result Value Ref Range   TSH 1.700 0.450 - 4.500 uIU/mL  CBC with Differential/Platelet  Result Value Ref Range   WBC 6.6 3.4 - 10.8 x10E3/uL   RBC 4.28 3.77 - 5.28 x10E6/uL   Hemoglobin 11.9 11.1 - 15.9 g/dL   Hematocrit 96.2 83.6 - 46.6 %   MCV 85 79 - 97 fL   MCH 27.8 26.6 - 33.0 pg   MCHC 32.7 31 - 35 g/dL   RDW 62.9 47.6 - 54.6 %   Platelets 301 150 - 450 x10E3/uL   Neutrophils 65 Not Estab. %   Lymphs 23 Not Estab. %   Monocytes 7 Not Estab. %   Eos 3 Not Estab. %   Basos 1 Not Estab. %   Neutrophils Absolute 4.4 1 - 7 x10E3/uL   Lymphocytes Absolute 1.5 0 - 3 x10E3/uL   Monocytes Absolute 0.5 0 - 0 x10E3/uL   EOS (ABSOLUTE) 0.2 0.0 - 0.4 x10E3/uL   Basophils Absolute 0.0 0 - 0 x10E3/uL   Immature Granulocytes 1 Not Estab. %   Immature Grans (Abs) 0.0 0.0 - 0.1 x10E3/uL  Comprehensive metabolic panel  Result Value Ref Range   Glucose 105 (H) 65 - 99 mg/dL   BUN 4 (L) 6 - 24 mg/dL   Creatinine, Ser 5.03 0.57 - 1.00 mg/dL   GFR calc non Af Amer 110 >59 mL/min/1.73   GFR calc Af Amer 127 >59 mL/min/1.73   BUN/Creatinine Ratio 7 (L) 9 - 23   Sodium 142 134 - 144 mmol/L   Potassium  4.0 3.5 - 5.2 mmol/L   Chloride 105 96 - 106 mmol/L   CO2 24 20 - 29 mmol/L   Calcium 9.0 8.7 - 10.2 mg/dL   Total Protein 6.9 6.0 - 8.5 g/dL   Albumin 4.2 3.8 - 4.8 g/dL   Globulin, Total 2.7 1.5 - 4.5 g/dL   Albumin/Globulin Ratio 1.6 1.2 - 2.2   Bilirubin  Total 0.6 0.0 - 1.2 mg/dL   Alkaline Phosphatase 82 48 - 121 IU/L   AST 13 0 - 40 IU/L   ALT 12 0 - 32 IU/L      Assessment & Plan:   Problem List Items Addressed This Visit      Respiratory   Acute bronchitis with COPD (HCC) - Primary    Chronic, stable on Symbicort.  Continue regimen with albuterol as needed also.  Spirometry performed today.  Follow up 3 months.      Relevant Orders   Spirometry with Graph (Completed)   CBC with Differential/Platelet (Completed)   Comprehensive metabolic panel (Completed)   Spirometry with Graph (Completed)     Other   Bipolar disorder (HCC)    Chronic, stable.  Continue to follow with Psychiatry.  TSH, lithium level, HgbA1c, CMP checked today per their request.      Relevant Orders   TSH (Completed)   Change in stool habits    Acute, ongoing.  Unclear etiology.  Will check stool panel.  If negative and symptoms persist, consider referral to GI for fecal incontinence.  Discussed possible causes including IBS and anal sphincter deficits.       Relevant Orders   Cdiff NAA+O+P+Stool Culture   Bug bite    Acute, ongoing.  Attacked by red ants over the weekend.  Will start triamcinolone cream to help with the itching and prevent further abrasions.  S/s infection discussed.  Patient to return to clinic if bites continue to cause discomfort in 1-2 weeks.       Other Visit Diagnoses    Long-term use of high-risk medication       Relevant Orders   Lithium level (Completed)   HgB A1c (Completed)   CBC with Differential/Platelet (Completed)   Comprehensive metabolic panel (Completed)       Follow up plan: Return in about 3 months (around 05/18/2020) for follow up.

## 2020-02-16 NOTE — Patient Instructions (Signed)

## 2020-02-17 DIAGNOSIS — W57XXXA Bitten or stung by nonvenomous insect and other nonvenomous arthropods, initial encounter: Secondary | ICD-10-CM | POA: Insufficient documentation

## 2020-02-17 DIAGNOSIS — R194 Change in bowel habit: Secondary | ICD-10-CM | POA: Insufficient documentation

## 2020-02-17 LAB — COMPREHENSIVE METABOLIC PANEL
ALT: 12 IU/L (ref 0–32)
AST: 13 IU/L (ref 0–40)
Albumin/Globulin Ratio: 1.6 (ref 1.2–2.2)
Albumin: 4.2 g/dL (ref 3.8–4.8)
Alkaline Phosphatase: 82 IU/L (ref 48–121)
BUN/Creatinine Ratio: 7 — ABNORMAL LOW (ref 9–23)
BUN: 4 mg/dL — ABNORMAL LOW (ref 6–24)
Bilirubin Total: 0.6 mg/dL (ref 0.0–1.2)
CO2: 24 mmol/L (ref 20–29)
Calcium: 9 mg/dL (ref 8.7–10.2)
Chloride: 105 mmol/L (ref 96–106)
Creatinine, Ser: 0.57 mg/dL (ref 0.57–1.00)
GFR calc Af Amer: 127 mL/min/{1.73_m2} (ref >59)
GFR calc non Af Amer: 110 mL/min/{1.73_m2} (ref >59)
Globulin, Total: 2.7 g/dL (ref 1.5–4.5)
Glucose: 105 mg/dL — ABNORMAL HIGH (ref 65–99)
Potassium: 4 mmol/L (ref 3.5–5.2)
Sodium: 142 mmol/L (ref 134–144)
Total Protein: 6.9 g/dL (ref 6.0–8.5)

## 2020-02-17 LAB — CBC WITH DIFFERENTIAL/PLATELET
Basophils Absolute: 0 10*3/uL (ref 0.0–0.2)
Basos: 1 %
EOS (ABSOLUTE): 0.2 10*3/uL (ref 0.0–0.4)
Eos: 3 %
Hematocrit: 36.4 % (ref 34.0–46.6)
Hemoglobin: 11.9 g/dL (ref 11.1–15.9)
Immature Grans (Abs): 0 10*3/uL (ref 0.0–0.1)
Immature Granulocytes: 1 %
Lymphocytes Absolute: 1.5 10*3/uL (ref 0.7–3.1)
Lymphs: 23 %
MCH: 27.8 pg (ref 26.6–33.0)
MCHC: 32.7 g/dL (ref 31.5–35.7)
MCV: 85 fL (ref 79–97)
Monocytes Absolute: 0.5 10*3/uL (ref 0.1–0.9)
Monocytes: 7 %
Neutrophils Absolute: 4.4 10*3/uL (ref 1.4–7.0)
Neutrophils: 65 %
Platelets: 301 10*3/uL (ref 150–450)
RBC: 4.28 x10E6/uL (ref 3.77–5.28)
RDW: 13.3 % (ref 11.7–15.4)
WBC: 6.6 10*3/uL (ref 3.4–10.8)

## 2020-02-17 LAB — HEMOGLOBIN A1C
Est. average glucose Bld gHb Est-mCnc: 97 mg/dL
Hgb A1c MFr Bld: 5 % (ref 4.8–5.6)

## 2020-02-17 LAB — LITHIUM LEVEL: Lithium Lvl: 0.9 mmol/L (ref 0.5–1.2)

## 2020-02-17 LAB — TSH: TSH: 1.7 u[IU]/mL (ref 0.450–4.500)

## 2020-02-17 NOTE — Assessment & Plan Note (Signed)
Chronic, stable.  Continue to follow with Psychiatry.  TSH, lithium level, HgbA1c, CMP checked today per their request.

## 2020-02-17 NOTE — Assessment & Plan Note (Addendum)
Chronic, stable on Symbicort.  Continue regimen with albuterol as needed also.  Spirometry performed today.  Follow up 3 months.

## 2020-02-17 NOTE — Assessment & Plan Note (Signed)
Acute, ongoing.  Attacked by red ants over the weekend.  Will start triamcinolone cream to help with the itching and prevent further abrasions.  S/s infection discussed.  Patient to return to clinic if bites continue to cause discomfort in 1-2 weeks.

## 2020-02-17 NOTE — Assessment & Plan Note (Signed)
Acute, ongoing.  Unclear etiology.  Will check stool panel.  If negative and symptoms persist, consider referral to GI for fecal incontinence.  Discussed possible causes including IBS and anal sphincter deficits.

## 2020-02-20 ENCOUNTER — Telehealth: Payer: Self-pay | Admitting: Family Medicine

## 2020-02-20 ENCOUNTER — Ambulatory Visit: Payer: Self-pay

## 2020-02-20 NOTE — Telephone Encounter (Signed)
° °  SF 02/20/2020    Name: Sandra Jensen    MRN: [id number]    DOB: 07/27/1970    AGE: 49 y.o.    GENDER: female    PCP Olevia Perches P, DO.   Spoke with patient's sister Annabell Howells regarding the [employer] Referral for a gas card. Ms. Vernie Shanks stated that her sister [name] will need gas to come to appoint at the office scheduled for Monday, February 23, 2020. Informed Ms. Maris that [place] can receive a gas card from the [employer]. Sent copy of gas card to front office and asked Front Reception to give to Ms. Kreuser when she arrives on Monday. No additional needs at this time.    Closing referral pending any other needs of patient.    [facility] Care Guide, Embedded Care Coordination [facility], Care Management Phone: [phone] Email: [email or web address]

## 2020-02-20 NOTE — Telephone Encounter (Signed)
Pt saw the dr on Monday. Pt experiencing some vision issues which keep her stumbling when she walks. She wants to know if there is anything the dr can do to help her until she gets to eye dr on 9/03. Not sure exactly what she is wanting, but told her I could have nurse call. Please use number above, her phone got wet and cannot use her phone Pt. Reports she has lost her glasses and has difficulty seeing. Seeing an optometrist next week. Requests to see her PCP to "get checked out that nothing else is wrong." Appointment made as requested.  Reason for Disposition  [1] Brief (now gone) blurred vision AND [2] unexplained  Answer Assessment - Initial Assessment Questions 1. DESCRIPTION: "What is the vision loss like? Describe it for me." (e.g., complete vision loss, blurred vision, double vision, floaters, etc.)     Blurry, driving at night is worse 2. LOCATION: "One or both eyes?" If one, ask: "Which eye?"     Both 3. SEVERITY: "Can you see anything?" If Yes, ask: "What can you see?" (e.g., fine print)     Yes 4. ONSET: "When did this begin?" "Did it start suddenly or has this been gradual?"     1 month 5. PATTERN: "Does this come and go, or has it been constant since it started?"     Constant 6. PAIN: "Is there any pain in your eye(s)?"  (Scale 1-10; or mild, moderate, severe)     Pain 7. CONTACTS-GLASSES: "Do you wear contacts or glasses?"     Loss her glasses 8. CAUSE: "What do you think is causing this visual problem?"     I need glasses 9. OTHER SYMPTOMS: "Do you have any other symptoms?" (e.g., confusion, headache, arm or leg weakness, speech problems)     Mild headache 10. PREGNANCY: "Is there any chance you are pregnant?" "When was your last menstrual period?"       No  Protocols used: VISION LOSS OR CHANGE-A-AH

## 2020-02-23 ENCOUNTER — Other Ambulatory Visit: Payer: Self-pay

## 2020-02-23 ENCOUNTER — Ambulatory Visit: Payer: Medicare Other | Admitting: Family Medicine

## 2020-02-23 ENCOUNTER — Encounter: Payer: Self-pay | Admitting: Family Medicine

## 2020-02-23 ENCOUNTER — Ambulatory Visit (INDEPENDENT_AMBULATORY_CARE_PROVIDER_SITE_OTHER): Payer: Medicare Other | Admitting: Family Medicine

## 2020-02-23 VITALS — BP 122/80 | HR 78 | Temp 98.6°F | Wt 194.0 lb

## 2020-02-23 DIAGNOSIS — Z1211 Encounter for screening for malignant neoplasm of colon: Secondary | ICD-10-CM

## 2020-02-23 DIAGNOSIS — B372 Candidiasis of skin and nail: Secondary | ICD-10-CM | POA: Diagnosis not present

## 2020-02-23 DIAGNOSIS — R5382 Chronic fatigue, unspecified: Secondary | ICD-10-CM

## 2020-02-23 DIAGNOSIS — R2689 Other abnormalities of gait and mobility: Secondary | ICD-10-CM | POA: Diagnosis not present

## 2020-02-23 DIAGNOSIS — G4710 Hypersomnia, unspecified: Secondary | ICD-10-CM

## 2020-02-23 DIAGNOSIS — R159 Full incontinence of feces: Secondary | ICD-10-CM | POA: Diagnosis not present

## 2020-02-23 LAB — URINALYSIS, ROUTINE W REFLEX MICROSCOPIC
Bilirubin, UA: NEGATIVE
Glucose, UA: NEGATIVE
Ketones, UA: NEGATIVE
Leukocytes,UA: NEGATIVE
Nitrite, UA: NEGATIVE
Specific Gravity, UA: >1.03 — ABNORMAL HIGH (ref 1.005–1.030)
Urobilinogen, Ur: 0.2 mg/dL (ref 0.2–1.0)
pH, UA: 5.5 (ref 5.0–7.5)

## 2020-02-23 LAB — MICROSCOPIC EXAMINATION

## 2020-02-23 MED ORDER — NYSTATIN 100000 UNIT/GM EX CREA: 1.0000 "application " | TOPICAL_CREAM | Freq: Two times a day (BID) | CUTANEOUS | 0 refills | Status: DC

## 2020-02-23 MED ORDER — NYSTATIN 100000 UNIT/GM EX POWD: 1.0000 "application " | Freq: Three times a day (TID) | CUTANEOUS | 0 refills | Status: DC

## 2020-02-23 NOTE — Assessment & Plan Note (Signed)
Concern for OSA vs narcolepsy vs. Polypharmacy. Checking labs and utox today. Fell asleep >7 times in 30 minute appointment today. Will likely need sleep study- will get her into sleep medicine ASAP. Referral generated today. Await results. Recheck 2-4 weeks.

## 2020-02-23 NOTE — Progress Notes (Signed)
BP 122/80 (BP Location: Left Arm, Patient Position: Sitting, Cuff Size: Normal)   Pulse 78   Temp 98.6 F (37 C) (Oral)   Wt 194 lb (88 kg)   LMP 02/11/2020   SpO2 98%   BMI 31.79 kg/m    Subjective:    Patient ID: Sandra Jensen, female    DOB: 06/24/1971, 49 y.o.   MRN: [id number]  HPI: Sandra Jensen is a 49 y.o. female  Chief Complaint  Patient presents with  . ballance  . Fatigue  . Rash    bottom is raw  . Irritable Bowel Syndrome  . Decreased Visual Acuity    has eye aptp 02/27/20   Has been having issues with her balance for a couple of months. She will be walking and then will stumble. Feels like she has to rock when she's standing. She has known arthritis and follows with pain management. They are trying to get her into a hip specialist. Seeing pain clinic at [facility] in [facility].   FATIGUE- falls asleep all the time. She will be sitting and immediately fall asleep. It doesn't matter if she has gotten a full nights sleep. It happens at any time of day. She notes that she is only taking her medication at night. She states that she has mentioned it to her psychiatrist, and doesn't feel like it has been addressed.  Duration:  About 6 months Severity: severe  Onset: gradual Context when symptoms started:  unknown Symptoms improve with rest: no  Depressive symptoms: yes Stress/anxiety: yes Insomnia: no  Snoring: yes Observed apnea by bed partner: no Daytime hypersomnolence:yes Wakes feeling refreshed: no History of sleep study: no Dysnea on exertion:  no Orthopnea/PND: no Chest pain: no Chronic cough: no Lower extremity edema: no Arthralgias:yes Myalgias: yes Weakness: no Rash: no  ABDOMINAL ISSUES- Has not been able to control her bowels for over a year. She has diarrhea several times a day. She doesn't know when it's going to come. She usually soils herself- she currently has a rash on her buttock because of the incontinence  Duration: about 6  months Nature: cramping Location: diffuse  Severity: moderate  Radiation: no Episode duration: Frequency: every bowel movement Alleviating factors: Aggravating factors: Treatments attempted: none Constipation: no Diarrhea: yes Episodes of diarrhea/day: 5-6x Mucous in the stool: yes Heartburn: yes Bloating:yes Flatulence: no Nausea: yes Vomiting: no Episodes of vomit/day: none Melena or hematochezia: no Rash: no Jaundice: no Fever: no Weight loss: no    Relevant past medical, surgical, family and social history reviewed and updated as indicated. Interim medical history since our last visit reviewed. Allergies and medications reviewed and updated.  Review of Systems  Constitutional: Positive for fatigue. Negative for activity change, appetite change, chills, diaphoresis, fever and unexpected weight change.  HENT: Negative.   Respiratory: Negative.   Cardiovascular: Negative.   Gastrointestinal: Negative.   Endocrine: Negative.   Genitourinary: Negative.   Musculoskeletal: Positive for arthralgias, back pain and myalgias. Negative for gait problem, joint swelling, neck pain and neck stiffness.  Neurological: Positive for weakness. Negative for dizziness, tremors, seizures, syncope, facial asymmetry, speech difficulty, light-headedness, numbness and headaches.  Hematological: Negative.   Psychiatric/Behavioral: Positive for confusion, decreased concentration, dysphoric mood and sleep disturbance. Negative for agitation, behavioral problems, hallucinations, self-injury and suicidal ideas. The patient is not nervous/anxious and is not hyperactive.     Per HPI unless specifically indicated above     Objective:    BP 122/80 (BP Location:  Left Arm, Patient Position: Sitting, Cuff Size: Normal)   Pulse 78   Temp 98.6 F (37 C) (Oral)   Wt 194 lb (88 kg)   LMP 02/11/2020   SpO2 98%   BMI 31.79 kg/m   Wt Readings from Last 3 Encounters:  02/23/20 194 lb (88 kg)    02/16/20 192 lb 12.8 oz (87.5 kg)  01/19/20 198 lb (89.8 kg)     Hearing Screening   125Hz  250Hz  500Hz  1000Hz  2000Hz  3000Hz  4000Hz  6000Hz  8000Hz   Right ear:           Left ear:             Visual Acuity Screening   Right eye Left eye Both eyes  Without correction: 20/50 20/50 20/50   With correction:       Physical Exam Vitals and nursing note reviewed.  Constitutional:      General: She is not in acute distress.    Appearance: Normal appearance. She is not ill-appearing, toxic-appearing or diaphoretic.     Comments: Falling asleep while speaking to me  HENT:     Head: Normocephalic and atraumatic.     Right Ear: External ear normal.     Left Ear: External ear normal.     Nose: Nose normal.     Mouth/Throat:     Mouth: Mucous membranes are moist.     Pharynx: Oropharynx is clear.  Eyes:     General: No scleral icterus.       Right eye: No discharge.        Left eye: No discharge.     Extraocular Movements: Extraocular movements intact.     Conjunctiva/sclera: Conjunctivae normal.     Pupils: Pupils are equal, round, and reactive to light.  Cardiovascular:     Rate and Rhythm: Normal rate and regular rhythm.     Pulses: Normal pulses.     Heart sounds: Normal heart sounds. No murmur heard.  No friction rub. No gallop.   Pulmonary:     Effort: Pulmonary effort is normal. No respiratory distress.     Breath sounds: Normal breath sounds. No stridor. No wheezing, rhonchi or rales.  Chest:     Chest wall: No tenderness.  Musculoskeletal:        General: Normal range of motion.     Cervical back: Normal range of motion and neck supple.  Skin:    General: Skin is warm and dry.     Capillary Refill: Capillary refill takes less than 2 seconds.     Coloration: Skin is not jaundiced or pale.     Findings: No bruising, erythema, lesion or rash.  Neurological:     General: No focal deficit present.     Mental Status: She is alert and oriented to person, place, and time.  Mental status is at baseline.     Comments: Falling asleep while speaking to me  Psychiatric:        Mood and Affect: Mood normal.        Behavior: Behavior normal.        Thought Content: Thought content normal.        Judgment: Judgment normal.     Comments: Falling asleep while speaking to me     Results for orders placed or performed in visit on 02/16/20  Lithium level  Result Value Ref Range   Lithium Lvl 0.9 0.5 - 1.2 mmol/L  HgB A1c  Result Value Ref Range   Hgb A1c MFr  Bld 5.0 4.8 - 5.6 %   Est. average glucose Bld gHb Est-mCnc 97 mg/dL  TSH  Result Value Ref Range   TSH 1.700 0.450 - 4.500 uIU/mL  CBC with Differential/Platelet  Result Value Ref Range   WBC 6.6 3.4 - 10.8 x10E3/uL   RBC 4.28 3.77 - 5.28 x10E6/uL   Hemoglobin 11.9 11.1 - 15.9 g/dL   Hematocrit 70.2 63.7 - 46.6 %   MCV 85 79 - 97 fL   MCH 27.8 26.6 - 33.0 pg   MCHC 32.7 31 - 35 g/dL   RDW 85.8 85.0 - 27.7 %   Platelets 301 150 - 450 x10E3/uL   Neutrophils 65 Not Estab. %   Lymphs 23 Not Estab. %   Monocytes 7 Not Estab. %   Eos 3 Not Estab. %   Basos 1 Not Estab. %   Neutrophils Absolute 4.4 1 - 7 x10E3/uL   Lymphocytes Absolute 1.5 0 - 3 x10E3/uL   Monocytes Absolute 0.5 0 - 0 x10E3/uL   EOS (ABSOLUTE) 0.2 0.0 - 0.4 x10E3/uL   Basophils Absolute 0.0 0 - 0 x10E3/uL   Immature Granulocytes 1 Not Estab. %   Immature Grans (Abs) 0.0 0.0 - 0.1 x10E3/uL  Comprehensive metabolic panel  Result Value Ref Range   Glucose 105 (H) 65 - 99 mg/dL   BUN 4 (L) 6 - 24 mg/dL   Creatinine, Ser 4.12 0.57 - 1.00 mg/dL   GFR calc non Af Amer 110 >59 mL/min/1.73   GFR calc Af Amer 127 >59 mL/min/1.73   BUN/Creatinine Ratio 7 (L) 9 - 23   Sodium 142 134 - 144 mmol/L   Potassium 4.0 3.5 - 5.2 mmol/L   Chloride 105 96 - 106 mmol/L   CO2 24 20 - 29 mmol/L   Calcium 9.0 8.7 - 10.2 mg/dL   Total Protein 6.9 6.0 - 8.5 g/dL   Albumin 4.2 3.8 - 4.8 g/dL   Globulin, Total 2.7 1.5 - 4.5 g/dL   Albumin/Globulin  Ratio 1.6 1.2 - 2.2   Bilirubin Total 0.6 0.0 - 1.2 mg/dL   Alkaline Phosphatase 82 48 - 121 IU/L   AST 13 0 - 40 IU/L   ALT 12 0 - 32 IU/L      Assessment & Plan:   Problem List Items Addressed This Visit      Other   Hypersomnolence - Primary    Concern for OSA vs narcolepsy vs. Polypharmacy. Checking labs and utox today. Fell asleep >7 times in 30 minute appointment today. Will likely need sleep study- will get her into sleep medicine ASAP. Referral generated today. Await results. Recheck 2-4 weeks.       Relevant Orders   Ambulatory referral to Sleep Studies    Other Visit Diagnoses    Chronic fatigue       See discussion under hypersomolence.   Relevant Orders   Comprehensive metabolic panel   CBC with Differential/Platelet   TSH   Lyme Ab/Western Blot Reflex   Rocky mtn spotted fvr abs pnl(IgG+IgM)   Ehrlichia Antibody Panel   Babesia microti Antibody Panel   [id number] 11+Oxyco+Alc+Crt-Bund   Urinalysis, Routine w reflex microscopic   Ambulatory referral to Sleep Studies   Full incontinence of feces       Of unclear etiology. Unclear if she has awareness. Will check stool studies and labs. Due for screening colonoscopy. Referral to GI made today. Call w/ concerns   Relevant Orders   Fecal occult blood, imunochemical([facility])  Fecal leukocytes   Stool Culture   Ova and parasite examination   Ambulatory referral to Gastroenterology   Balance problem       Concern for combination of hypersomolence and orthopedic issues, possibly with polypharmacy. Will address hypersomolence and vision first. Recheck 2-4 wks   Candidal skin infection       Will treat with nystatin and desitin. Call if not getting better or getting worse.    Relevant Medications   nystatin (MYCOSTATIN/NYSTOP) powder   nystatin cream (MYCOSTATIN)   Screening for colon cancer       Referral to GI made today.   Relevant Orders   Ambulatory referral to Gastroenterology       Follow up  plan: Return 2-4 weeks.

## 2020-02-23 NOTE — Telephone Encounter (Signed)
Voucher provided to pt by care guide.

## 2020-02-25 LAB — CBC WITH DIFFERENTIAL/PLATELET
Basophils Absolute: 0.1 10*3/uL (ref 0.0–0.2)
Basos: 1 %
EOS (ABSOLUTE): 0.4 10*3/uL (ref 0.0–0.4)
Eos: 5 %
Hematocrit: 37.9 % (ref 34.0–46.6)
Hemoglobin: 11.7 g/dL (ref 11.1–15.9)
Immature Grans (Abs): 0 10*3/uL (ref 0.0–0.1)
Immature Granulocytes: 0 %
Lymphocytes Absolute: 1.3 10*3/uL (ref 0.7–3.1)
Lymphs: 18 %
MCH: 26.8 pg (ref 26.6–33.0)
MCHC: 30.9 g/dL — ABNORMAL LOW (ref 31.5–35.7)
MCV: 87 fL (ref 79–97)
Monocytes Absolute: 0.5 10*3/uL (ref 0.1–0.9)
Monocytes: 6 %
Neutrophils Absolute: 5.1 10*3/uL (ref 1.4–7.0)
Neutrophils: 70 %
Platelets: 372 10*3/uL (ref 150–450)
RBC: 4.37 x10E6/uL (ref 3.77–5.28)
RDW: 13.2 % (ref 11.7–15.4)
WBC: 7.4 10*3/uL (ref 3.4–10.8)

## 2020-02-25 LAB — COMPREHENSIVE METABOLIC PANEL
ALT: 23 IU/L (ref 0–32)
AST: 13 IU/L (ref 0–40)
Albumin/Globulin Ratio: 1.5 (ref 1.2–2.2)
Albumin: 4.1 g/dL (ref 3.8–4.8)
Alkaline Phosphatase: 153 IU/L — ABNORMAL HIGH (ref 48–121)
BUN/Creatinine Ratio: 6 — ABNORMAL LOW (ref 9–23)
BUN: 4 mg/dL — ABNORMAL LOW (ref 6–24)
Bilirubin Total: 0.5 mg/dL (ref 0.0–1.2)
CO2: 23 mmol/L (ref 20–29)
Calcium: 9 mg/dL (ref 8.7–10.2)
Chloride: 103 mmol/L (ref 96–106)
Creatinine, Ser: 0.66 mg/dL (ref 0.57–1.00)
GFR calc Af Amer: 121 mL/min/{1.73_m2} (ref >59)
GFR calc non Af Amer: 105 mL/min/{1.73_m2} (ref >59)
Globulin, Total: 2.7 g/dL (ref 1.5–4.5)
Glucose: 93 mg/dL (ref 65–99)
Potassium: 3.2 mmol/L — ABNORMAL LOW (ref 3.5–5.2)
Sodium: 137 mmol/L (ref 134–144)
Total Protein: 6.8 g/dL (ref 6.0–8.5)

## 2020-02-25 LAB — EHRLICHIA ANTIBODY PANEL
E. Chaffeensis (HME) IgM Titer: NEGATIVE
E.Chaffeensis (HME) IgG: NEGATIVE
HGE IgG Titer: NEGATIVE
HGE IgM Titer: NEGATIVE

## 2020-02-25 LAB — ROCKY MTN SPOTTED FVR ABS PNL(IGG+IGM)
RMSF IgG: NEGATIVE
RMSF IgM: 1.02 index — ABNORMAL HIGH (ref 0.00–0.89)

## 2020-02-25 LAB — LYME AB/WESTERN BLOT REFLEX
LYME DISEASE AB, QUANT, IGM: 1.28 index — ABNORMAL HIGH (ref 0.00–0.79)
Lyme IgG/IgM Ab: <0.91 {ISR} (ref 0.00–0.90)

## 2020-02-25 LAB — LYME, WESTERN BLOT, SERUM (REFLEXED)
IgG P18 Ab.: ABSENT
IgG P23 Ab.: ABSENT
IgG P28 Ab.: ABSENT
IgG P30 Ab.: ABSENT
IgG P39 Ab.: ABSENT
IgG P45 Ab.: ABSENT
IgG P58 Ab.: ABSENT
IgG P66 Ab.: ABSENT
IgG P93 Ab.: ABSENT
IgM P23 Ab.: ABSENT
IgM P39 Ab.: ABSENT
IgM P41 Ab.: ABSENT
Lyme IgG Wb: NEGATIVE
Lyme IgM Wb: NEGATIVE

## 2020-02-25 LAB — BABESIA MICROTI ANTIBODY PANEL
Babesia microti IgG: <1:10 {titer}
Babesia microti IgM: <1:10 {titer}

## 2020-02-25 LAB — TSH: TSH: 3.62 u[IU]/mL (ref 0.450–4.500)

## 2020-02-26 LAB — DRUG SCREEN 764883 11+OXYCO+ALC+CRT-BUND
Barbiturate: NEGATIVE ng/mL
Cannabinoid Quant, Ur: NEGATIVE ng/mL
Cocaine (Metabolite): NEGATIVE ng/mL
Creatinine: 441.5 mg/dL — ABNORMAL HIGH (ref 20.0–300.0)
Ethanol: NEGATIVE %
Meperidine: NEGATIVE ng/mL
Methadone Screen, Urine: NEGATIVE ng/mL
Phencyclidine: NEGATIVE ng/mL
Propoxyphene: NEGATIVE ng/mL
Tramadol: NEGATIVE ng/mL
pH, Urine: 5.6 (ref 4.5–8.9)

## 2020-02-26 LAB — BENZODIAZEPINES CONFIRM, URINE
Alprazolam: NEGATIVE
Benzodiazepines: POSITIVE ng/mL — AB
Clonazepam Conf.: 949 ng/mL
Clonazepam: POSITIVE — AB
Flurazepam: NEGATIVE
Lorazepam: NEGATIVE
Midazolam: NEGATIVE
Nordiazepam Conf: 171 ng/mL
Nordiazepam: POSITIVE — AB
Oxazepam Conf: 1987 ng/mL
Oxazepam: POSITIVE — AB
Temazepam Conf.: 554 ng/mL
Temazepam: POSITIVE — AB
Triazolam: NEGATIVE

## 2020-02-26 LAB — OXYCODONE/OXYMORPHONE, CONFIRM
OXYCODONE/OXYMORPH: POSITIVE — AB
OXYCODONE: >3000 ng/mL
OXYCODONE: POSITIVE — AB
OXYMORPHONE (GC/MS): >3000 ng/mL
OXYMORPHONE: POSITIVE — AB

## 2020-02-26 LAB — DRUG PROFILE 799016
Amphetamine GC/MS Conf: >3000 ng/mL
Amphetamine: POSITIVE — AB
Amphetamines: POSITIVE — AB
Methamphetamine: NEGATIVE

## 2020-02-26 LAB — OPIATES CONFIRMATION, URINE: Opiates: NEGATIVE ng/mL

## 2020-03-08 DIAGNOSIS — M169 Osteoarthritis of hip, unspecified: Secondary | ICD-10-CM | POA: Diagnosis not present

## 2020-03-08 DIAGNOSIS — M1712 Unilateral primary osteoarthritis, left knee: Secondary | ICD-10-CM | POA: Diagnosis not present

## 2020-03-08 DIAGNOSIS — Z79899 Other long term (current) drug therapy: Secondary | ICD-10-CM | POA: Diagnosis not present

## 2020-03-09 ENCOUNTER — Telehealth (HOSPITAL_COMMUNITY): Payer: Self-pay | Admitting: *Deleted

## 2020-03-09 NOTE — Telephone Encounter (Signed)
Pt called stating that she feels the Lithium is not working, requesting higher dose. Pt is currently on total 900mg  qd. Pt last level looks to be on 02/16/20 with a level of 0.9.pt has an appointment on 04/19/20. Please review.

## 2020-03-10 NOTE — Telephone Encounter (Signed)
She is positive for amphetamine and opiates. They are not in list of her meds. I will defer adding any more meds. She need to see therapist which we recommended on last appointment.

## 2020-03-11 ENCOUNTER — Telehealth: Payer: Self-pay | Admitting: Family Medicine

## 2020-03-11 NOTE — Telephone Encounter (Signed)
Info has been sent to [phone].  Release [id number]  Thank you.

## 2020-03-11 NOTE — Telephone Encounter (Signed)
Sandra Caves, do you have this stuff to send over for the referral?

## 2020-03-11 NOTE — Telephone Encounter (Signed)
[facility] has received the referral but They need a signed order for the sleep study and signed face to face OV notes and her demographics and insurance / please advise   Fax# [phone]

## 2020-03-22 ENCOUNTER — Ambulatory Visit: Payer: Medicare Other | Admitting: Family Medicine

## 2020-04-05 DIAGNOSIS — D539 Nutritional anemia, unspecified: Secondary | ICD-10-CM | POA: Diagnosis not present

## 2020-04-05 DIAGNOSIS — M1712 Unilateral primary osteoarthritis, left knee: Secondary | ICD-10-CM | POA: Diagnosis not present

## 2020-04-05 DIAGNOSIS — E559 Vitamin D deficiency, unspecified: Secondary | ICD-10-CM | POA: Diagnosis not present

## 2020-04-05 DIAGNOSIS — M129 Arthropathy, unspecified: Secondary | ICD-10-CM | POA: Diagnosis not present

## 2020-04-05 DIAGNOSIS — Z20822 Contact with and (suspected) exposure to covid-19: Secondary | ICD-10-CM | POA: Diagnosis not present

## 2020-04-05 DIAGNOSIS — Z23 Encounter for immunization: Secondary | ICD-10-CM | POA: Diagnosis not present

## 2020-04-05 DIAGNOSIS — E7800 Pure hypercholesterolemia, unspecified: Secondary | ICD-10-CM | POA: Diagnosis not present

## 2020-04-05 DIAGNOSIS — R300 Dysuria: Secondary | ICD-10-CM | POA: Diagnosis not present

## 2020-04-05 DIAGNOSIS — Z79899 Other long term (current) drug therapy: Secondary | ICD-10-CM | POA: Diagnosis not present

## 2020-04-05 DIAGNOSIS — M25552 Pain in left hip: Secondary | ICD-10-CM | POA: Diagnosis not present

## 2020-04-05 DIAGNOSIS — M169 Osteoarthritis of hip, unspecified: Secondary | ICD-10-CM | POA: Diagnosis not present

## 2020-04-08 DIAGNOSIS — M129 Arthropathy, unspecified: Secondary | ICD-10-CM | POA: Diagnosis not present

## 2020-04-08 DIAGNOSIS — Z79899 Other long term (current) drug therapy: Secondary | ICD-10-CM | POA: Diagnosis not present

## 2020-04-19 ENCOUNTER — Other Ambulatory Visit: Payer: Self-pay

## 2020-04-19 ENCOUNTER — Encounter (HOSPITAL_COMMUNITY): Payer: Medicare Other | Admitting: Psychiatry

## 2020-04-20 NOTE — Progress Notes (Signed)
No show

## 2020-05-06 DIAGNOSIS — Z79899 Other long term (current) drug therapy: Secondary | ICD-10-CM | POA: Diagnosis not present

## 2020-05-06 DIAGNOSIS — M169 Osteoarthritis of hip, unspecified: Secondary | ICD-10-CM | POA: Diagnosis not present

## 2020-05-06 DIAGNOSIS — M1712 Unilateral primary osteoarthritis, left knee: Secondary | ICD-10-CM | POA: Diagnosis not present

## 2020-05-06 DIAGNOSIS — M25552 Pain in left hip: Secondary | ICD-10-CM | POA: Diagnosis not present

## 2020-05-17 ENCOUNTER — Telehealth (HOSPITAL_COMMUNITY): Payer: Medicare Other | Admitting: Psychiatry

## 2020-05-24 ENCOUNTER — Ambulatory Visit: Payer: Medicare Other | Admitting: Gastroenterology

## 2020-05-24 ENCOUNTER — Encounter: Payer: Self-pay | Admitting: Gastroenterology

## 2020-05-25 DIAGNOSIS — Z79899 Other long term (current) drug therapy: Secondary | ICD-10-CM | POA: Diagnosis not present

## 2020-06-01 DIAGNOSIS — M1712 Unilateral primary osteoarthritis, left knee: Secondary | ICD-10-CM | POA: Diagnosis not present

## 2020-06-01 DIAGNOSIS — M25552 Pain in left hip: Secondary | ICD-10-CM | POA: Diagnosis not present

## 2020-06-03 ENCOUNTER — Other Ambulatory Visit: Payer: Self-pay | Admitting: Orthopedic Surgery

## 2020-06-03 DIAGNOSIS — M25552 Pain in left hip: Secondary | ICD-10-CM

## 2020-06-07 DIAGNOSIS — M25562 Pain in left knee: Secondary | ICD-10-CM | POA: Diagnosis not present

## 2020-06-07 DIAGNOSIS — M169 Osteoarthritis of hip, unspecified: Secondary | ICD-10-CM | POA: Diagnosis not present

## 2020-06-07 DIAGNOSIS — M5136 Other intervertebral disc degeneration, lumbar region: Secondary | ICD-10-CM | POA: Diagnosis not present

## 2020-06-07 DIAGNOSIS — Z79899 Other long term (current) drug therapy: Secondary | ICD-10-CM | POA: Diagnosis not present

## 2020-06-07 DIAGNOSIS — M545 Low back pain, unspecified: Secondary | ICD-10-CM | POA: Diagnosis not present

## 2020-06-28 DIAGNOSIS — Z79899 Other long term (current) drug therapy: Secondary | ICD-10-CM | POA: Diagnosis not present

## 2020-06-30 ENCOUNTER — Other Ambulatory Visit: Payer: Self-pay

## 2020-06-30 ENCOUNTER — Ambulatory Visit
Admission: RE | Admit: 2020-06-30 | Discharge: 2020-06-30 | Disposition: A | Discharge status: Home / Self Care | Payer: Medicare Other | Source: Ambulatory Visit | Attending: Orthopedic Surgery | Admitting: Orthopedic Surgery

## 2020-06-30 DIAGNOSIS — S73192A Other sprain of left hip, initial encounter: Secondary | ICD-10-CM | POA: Diagnosis not present

## 2020-06-30 DIAGNOSIS — M1612 Unilateral primary osteoarthritis, left hip: Secondary | ICD-10-CM | POA: Diagnosis not present

## 2020-06-30 DIAGNOSIS — M25552 Pain in left hip: Secondary | ICD-10-CM

## 2020-06-30 DIAGNOSIS — R262 Difficulty in walking, not elsewhere classified: Secondary | ICD-10-CM | POA: Diagnosis not present

## 2020-06-30 DIAGNOSIS — M79605 Pain in left leg: Secondary | ICD-10-CM | POA: Diagnosis not present

## 2020-07-06 DIAGNOSIS — Z79899 Other long term (current) drug therapy: Secondary | ICD-10-CM | POA: Diagnosis not present

## 2020-07-06 DIAGNOSIS — M5136 Other intervertebral disc degeneration, lumbar region: Secondary | ICD-10-CM | POA: Diagnosis not present

## 2020-07-06 DIAGNOSIS — M545 Low back pain, unspecified: Secondary | ICD-10-CM | POA: Diagnosis not present

## 2020-07-06 DIAGNOSIS — M1712 Unilateral primary osteoarthritis, left knee: Secondary | ICD-10-CM | POA: Diagnosis not present

## 2020-08-09 ENCOUNTER — Ambulatory Visit (INDEPENDENT_AMBULATORY_CARE_PROVIDER_SITE_OTHER): Payer: Medicare Other

## 2020-08-09 VITALS — Ht 66.0 in | Wt 186.0 lb

## 2020-08-09 DIAGNOSIS — Z0000 Encounter for general adult medical examination without abnormal findings: Secondary | ICD-10-CM | POA: Diagnosis not present

## 2020-08-09 DIAGNOSIS — M545 Low back pain, unspecified: Secondary | ICD-10-CM | POA: Diagnosis not present

## 2020-08-09 DIAGNOSIS — Z79899 Other long term (current) drug therapy: Secondary | ICD-10-CM | POA: Diagnosis not present

## 2020-08-09 DIAGNOSIS — M5136 Other intervertebral disc degeneration, lumbar region: Secondary | ICD-10-CM | POA: Diagnosis not present

## 2020-08-09 DIAGNOSIS — F17210 Nicotine dependence, cigarettes, uncomplicated: Secondary | ICD-10-CM | POA: Diagnosis not present

## 2020-08-09 DIAGNOSIS — M1712 Unilateral primary osteoarthritis, left knee: Secondary | ICD-10-CM | POA: Diagnosis not present

## 2020-08-09 DIAGNOSIS — F17200 Nicotine dependence, unspecified, uncomplicated: Secondary | ICD-10-CM | POA: Diagnosis not present

## 2020-08-09 NOTE — Patient Instructions (Signed)
Sandra Jensen , Thank you for taking time to come for your Medicare Wellness Visit. I appreciate your ongoing commitment to your health goals. Please review the following plan we discussed and let me know if I can assist you in the future.   Screening recommendations/referrals: Colonoscopy: decline Mammogram: completed 08/18/2019 Bone Density: n/a Recommended yearly ophthalmology/optometry visit for glaucoma screening and checkup Recommended yearly dental visit for hygiene and checkup  Vaccinations: Influenza vaccine: completed 04/05/2020, due 01/24/2021 Pneumococcal vaccine: completed 06/07/2018 Tdap vaccine: completed 11/25/2014, due 11/24/2024 Shingles vaccine: n/a  Covid-19: 02/11/2020, 02/25/2020  Advanced directives: Advance directive discussed with you today.   Conditions/risks identified: none  Next appointment: Follow up in one year for your annual wellness visit.   Preventive Care 40-64 Years, Female Preventive care refers to lifestyle choices and visits with your health care provider that can promote health and wellness. What does preventive care include?  A yearly physical exam. This is also called an annual well check.  Dental exams once or twice a year.  Routine eye exams. Ask your health care provider how often you should have your eyes checked.  Personal lifestyle choices, including:  Daily care of your teeth and gums.  Regular physical activity.  Eating a healthy diet.  Avoiding tobacco and drug use.  Limiting alcohol use.  Practicing safe sex.  Taking low-dose aspirin daily starting at age 34.  Taking vitamin and mineral supplements as recommended by your health care provider. What happens during an annual well check? The services and screenings done by your health care provider during your annual well check will depend on your age, overall health, lifestyle risk factors, and family history of disease. Counseling  Your health care provider may ask you  questions about your:  Alcohol use.  Tobacco use.  Drug use.  Emotional well-being.  Home and relationship well-being.  Sexual activity.  Eating habits.  Work and work Statistician.  Method of birth control.  Menstrual cycle.  Pregnancy history. Screening  You may have the following tests or measurements:  Height, weight, and BMI.  Blood pressure.  Lipid and cholesterol levels. These may be checked every 5 years, or more frequently if you are over 60 years old.  Skin check.  Lung cancer screening. You may have this screening every year starting at age 53 if you have a 30-pack-year history of smoking and currently smoke or have quit within the past 15 years.  Fecal occult blood test (FOBT) of the stool. You may have this test every year starting at age 65.  Flexible sigmoidoscopy or colonoscopy. You may have a sigmoidoscopy every 5 years or a colonoscopy every 10 years starting at age 56.  Hepatitis C blood test.  Hepatitis B blood test.  Sexually transmitted disease (STD) testing.  Diabetes screening. This is done by checking your blood sugar (glucose) after you have not eaten for a while (fasting). You may have this done every 1-3 years.  Mammogram. This may be done every 1-2 years. Talk to your health care provider about when you should start having regular mammograms. This may depend on whether you have a family history of breast cancer.  BRCA-related cancer screening. This may be done if you have a family history of breast, ovarian, tubal, or peritoneal cancers.  Pelvic exam and Pap test. This may be done every 3 years starting at age 62. Starting at age 79, this may be done every 5 years if you have a Pap test in combination with an  HPV test.  Bone density scan. This is done to screen for osteoporosis. You may have this scan if you are at high risk for osteoporosis. Discuss your test results, treatment options, and if necessary, the need for more tests with  your health care provider. Vaccines  Your health care provider may recommend certain vaccines, such as:  Influenza vaccine. This is recommended every year.  Tetanus, diphtheria, and acellular pertussis (Tdap, Td) vaccine. You may need a Td booster every 10 years.  Zoster vaccine. You may need this after age 48.  Pneumococcal 13-valent conjugate (PCV13) vaccine. You may need this if you have certain conditions and were not previously vaccinated.  Pneumococcal polysaccharide (PPSV23) vaccine. You may need one or two doses if you smoke cigarettes or if you have certain conditions. Talk to your health care provider about which screenings and vaccines you need and how often you need them. This information is not intended to replace advice given to you by your health care provider. Make sure you discuss any questions you have with your health care provider. Document Released: 07/09/2015 Document Revised: 03/01/2016 Document Reviewed: 04/13/2015 Elsevier Interactive Patient Education  2017 [place] Prevention in the Home Falls can cause injuries. They can happen to people of all ages. There are many things you can do to make your home safe and to help prevent falls. What can I do on the outside of my home?  Regularly fix the edges of walkways and driveways and fix any cracks.  Remove anything that might make you trip as you walk through a door, such as a raised step or threshold.  Trim any bushes or trees on the path to your home.  Use bright outdoor lighting.  Clear any walking paths of anything that might make someone trip, such as rocks or tools.  Regularly check to see if handrails are loose or broken. Make sure that both sides of any steps have handrails.  Any raised decks and porches should have guardrails on the edges.  Have any leaves, snow, or ice cleared regularly.  Use sand or salt on walking paths during winter.  Clean up any spills in your garage right  away. This includes oil or grease spills. What can I do in the bathroom?  Use night lights.  Install grab bars by the toilet and in the tub and shower. Do not use towel bars as grab bars.  Use non-skid mats or decals in the tub or shower.  If you need to sit down in the shower, use a plastic, non-slip stool.  Keep the floor dry. Clean up any water that spills on the floor as soon as it happens.  Remove soap buildup in the tub or shower regularly.  Attach bath mats securely with double-sided non-slip rug tape.  Do not have throw rugs and other things on the floor that can make you trip. What can I do in the bedroom?  Use night lights.  Make sure that you have a light by your bed that is easy to reach.  Do not use any sheets or blankets that are too big for your bed. They should not hang down onto the floor.  Have a firm chair that has side arms. You can use this for support while you get dressed.  Do not have throw rugs and other things on the floor that can make you trip. What can I do in the kitchen?  Clean up any spills right away.  Avoid walking on wet floors.  Keep items that you use a lot in easy-to-reach places.  If you need to reach something above you, use a strong step stool that has a grab bar.  Keep electrical cords out of the way.  Do not use floor polish or wax that makes floors slippery. If you must use wax, use non-skid floor wax.  Do not have throw rugs and other things on the floor that can make you trip. What can I do with my stairs?  Do not leave any items on the stairs.  Make sure that there are handrails on both sides of the stairs and use them. Fix handrails that are broken or loose. Make sure that handrails are as long as the stairways.  Check any carpeting to make sure that it is firmly attached to the stairs. Fix any carpet that is loose or worn.  Avoid having throw rugs at the top or bottom of the stairs. If you do have throw rugs, attach  them to the floor with carpet tape.  Make sure that you have a light switch at the top of the stairs and the bottom of the stairs. If you do not have them, ask someone to add them for you. What else can I do to help prevent falls?  Wear shoes that:  Do not have high heels.  Have rubber bottoms.  Are comfortable and fit you well.  Are closed at the toe. Do not wear sandals.  If you use a stepladder:  Make sure that it is fully opened. Do not climb a closed stepladder.  Make sure that both sides of the stepladder are locked into place.  Ask someone to hold it for you, if possible.  Clearly mark and make sure that you can see:  Any grab bars or handrails.  First and last steps.  Where the edge of each step is.  Use tools that help you move around (mobility aids) if they are needed. These include:  Canes.  Walkers.  Scooters.  Crutches.  Turn on the lights when you go into a dark area. Replace any light bulbs as soon as they burn out.  Set up your furniture so you have a clear path. Avoid moving your furniture around.  If any of your floors are uneven, fix them.  If there are any pets around you, be aware of where they are.  Review your medicines with your doctor. Some medicines can make you feel dizzy. This can increase your chance of falling. Ask your doctor what other things that you can do to help prevent falls. This information is not intended to replace advice given to you by your health care provider. Make sure you discuss any questions you have with your health care provider. Document Released: 04/08/2009 Document Revised: 11/18/2015 Document Reviewed: 07/17/2014 Elsevier Interactive Patient Education  2017 [employer].

## 2020-08-09 NOTE — Addendum Note (Signed)
Addended by: Elisha Ponder E on: 08/09/2020 11:04 AM   Modules accepted: Orders
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
## 2020-08-10 ENCOUNTER — Telehealth: Payer: Self-pay | Admitting: Family Medicine

## 2020-08-10 NOTE — Telephone Encounter (Signed)
   Telephone encounter was:  Successful.  08/10/2020 Name: [name] MRN: [id number] DOB: [birth year]  Sandra Jensen is a 50 y.o. year old female who is a primary care patient of Dorcas Carrow, DO . The community resource team was consulted for assistance with Food Insecurity  Care guide performed the following interventions: Discussed resources to assist with food insecurity. Asked patient is she would like a list of food pantries within the [place] sent to her via email. Patient stated she would. Sent e-mail to patient with the list of food pantires in [place]. Also informed patient that she may be eligible for a [employer] gift card from the [facility]. Patient asked that is she is eligible she woule like for the card to be mailed to her. Informed paitent that Care Guide will ask if card can be mailed to her. Paitent stated understanding.   .  Follow Up Plan:  Care Guide will complete [facility] referral and give patient a call back within a week.   [facility] Care Guide, Embedded Care Coordination [facility], Care Management Phone: [phone] Email: [email or web address]    Salomon Fick, [email or web address] SECURE: Food Resources  Hi Ms. Karoline Caldwell,   Thank you for taking the time to speak with me today regarding community resources. Attached you will find a list of food pantries in the [place] area. Please feel free to give me a call if you have any questions or concerns.   Thanks,   [facility] Guide, Embedded Care Coordination [facility], Care Management [address] [place], [place], [place] [address] Phone: [phone] Email: [email or web address]

## 2020-08-11 ENCOUNTER — Telehealth: Payer: Self-pay | Admitting: Family Medicine

## 2020-08-11 NOTE — Telephone Encounter (Signed)
Received e-mail from [place], [facility], that patient is eligible for [employer] gift card and that it will be purchased on Friday, August 13, 2020 and be sent via inter-office mail. Care Guide will give the patient a call to let her know.

## 2020-08-13 ENCOUNTER — Telehealth: Payer: Self-pay | Admitting: Family Medicine

## 2020-08-13 NOTE — Telephone Encounter (Signed)
   Telephone encounter was:  Successful.  08/13/2020 Name: [name] MRN: [id number] DOB: 1970-11-11  Sandra Jensen is a 50 y.o. year old female who is a primary care patient of Dorcas Carrow, DO . The community resource team was consulted for assistance with Food Insecurity  Care guide performed the following interventions: Spoke with patient regarding referral. Informed patient that she has been approved for a $150 [employer] card from [facility] and that it has been sent to the office. Also explained to patient that the organizaiton will not be able to mail it to her, but she can come pick it up from the office. Patient stated that she does not have a reliable form of transportation right now and would need assistance getting to the office to pick up the card. Care Guide will see what transportation resources are available and give patient a call back. Danelle Berry Care Guide, Embedded Care Coordination [facility], Care Management Phone: [phone] Email: [email or web address]

## 2020-08-17 ENCOUNTER — Telehealth: Payer: Self-pay | Admitting: Family Medicine

## 2020-08-17 NOTE — Telephone Encounter (Signed)
   Telephone encounter was:  Successful.  08/17/2020 Name: Sandra Jensen MRN: [id number] DOB: 09-12-1970  Sandra Jensen is a 50 y.o. year old female who is a primary care patient of Dorcas Carrow, DO . The community resource team was consulted for assistance with Food Insecurity  Care guide performed the following interventions: Spoke with patient today regarding referral. Informed patient that gift card is at the office and will be in an envelope with her name on it. Explained to patient that we do not have transportation resources available at this time. Asked patient if she has a family member or friend that will be willing to drive her to [facility] to pick up her [employer] card. Patient stated that she will be able to find someone and that she will pick up her gift card.  No addtional needs at this time. .  Follow Up Plan:  No further follow up planned at this time. The patient has been provided with needed resources.  [facility] Care Guide, Embedded Care Coordination [facility], Care Management Phone: [phone] Email: [email or web address]

## 2021-08-09 ENCOUNTER — Ambulatory Visit: Payer: 59 | Admitting: Family Medicine

## 2021-08-12 ENCOUNTER — Other Ambulatory Visit: Payer: Self-pay

## 2021-08-12 ENCOUNTER — Ambulatory Visit: Payer: Self-pay

## 2021-08-12 ENCOUNTER — Ambulatory Visit (INDEPENDENT_AMBULATORY_CARE_PROVIDER_SITE_OTHER): Payer: 59 | Admitting: Family Medicine

## 2021-08-12 ENCOUNTER — Encounter: Payer: Self-pay | Admitting: Family Medicine

## 2021-08-12 VITALS — BP 119/79 | HR 85 | Temp 97.9°F | Wt 208.2 lb

## 2021-08-12 DIAGNOSIS — J440 Chronic obstructive pulmonary disease with acute lower respiratory infection: Secondary | ICD-10-CM

## 2021-08-12 DIAGNOSIS — E6609 Other obesity due to excess calories: Secondary | ICD-10-CM

## 2021-08-12 DIAGNOSIS — R5382 Chronic fatigue, unspecified: Secondary | ICD-10-CM

## 2021-08-12 DIAGNOSIS — E66811 Obesity, class 1: Secondary | ICD-10-CM

## 2021-08-12 DIAGNOSIS — Z1211 Encounter for screening for malignant neoplasm of colon: Secondary | ICD-10-CM

## 2021-08-12 DIAGNOSIS — Z1231 Encounter for screening mammogram for malignant neoplasm of breast: Secondary | ICD-10-CM

## 2021-08-12 DIAGNOSIS — Z6833 Body mass index (BMI) 33.0-33.9, adult: Secondary | ICD-10-CM

## 2021-08-12 DIAGNOSIS — F3160 Bipolar disorder, current episode mixed, unspecified: Secondary | ICD-10-CM | POA: Diagnosis not present

## 2021-08-12 DIAGNOSIS — J449 Chronic obstructive pulmonary disease, unspecified: Secondary | ICD-10-CM

## 2021-08-12 DIAGNOSIS — G4700 Insomnia, unspecified: Secondary | ICD-10-CM

## 2021-08-12 DIAGNOSIS — E538 Deficiency of other specified B group vitamins: Secondary | ICD-10-CM

## 2021-08-12 DIAGNOSIS — F339 Major depressive disorder, recurrent, unspecified: Secondary | ICD-10-CM | POA: Diagnosis not present

## 2021-08-12 DIAGNOSIS — J209 Acute bronchitis, unspecified: Secondary | ICD-10-CM

## 2021-08-12 LAB — URINALYSIS, ROUTINE W REFLEX MICROSCOPIC
Bilirubin, UA: NEGATIVE
Glucose, UA: NEGATIVE
Ketones, UA: NEGATIVE
Leukocytes,UA: NEGATIVE
Nitrite, UA: NEGATIVE
Protein,UA: NEGATIVE
RBC, UA: NEGATIVE
Specific Gravity, UA: 1.02 (ref 1.005–1.030)
Urobilinogen, Ur: 0.2 mg/dL (ref 0.2–1.0)
pH, UA: 5.5 (ref 5.0–7.5)

## 2021-08-12 LAB — BAYER DCA HB A1C WAIVED: HB A1C (BAYER DCA - WAIVED): 4.9 % (ref 4.8–5.6)

## 2021-08-12 MED ORDER — ALBUTEROL SULFATE HFA 108 (90 BASE) MCG/ACT IN AERS: 1.0000 | INHALATION_SPRAY | Freq: Four times a day (QID) | RESPIRATORY_TRACT | 1 refills | Status: DC | PRN

## 2021-08-12 MED ORDER — BUDESONIDE-FORMOTEROL FUMARATE 80-4.5 MCG/ACT IN AERO: 2.0000 | INHALATION_SPRAY | Freq: Two times a day (BID) | RESPIRATORY_TRACT | 2 refills | Status: DC

## 2021-08-12 NOTE — Assessment & Plan Note (Signed)
Rechecking labs today. Await results. Treat as needed.  °

## 2021-08-12 NOTE — Assessment & Plan Note (Signed)
Under good control on current regimen. Continue current regimen. Continue to monitor. Call with any concerns. Refills given. Labs drawn today.   

## 2021-08-12 NOTE — Progress Notes (Signed)
BP 119/79    Pulse 85    Temp 97.9 F (36.6 C)    Wt 208 lb 3.2 oz (94.4 kg)    SpO2 99%    BMI 33.60 kg/m    Subjective:    Patient ID: Sandra Jensen, female    DOB: Jan 17, 1971, 51 y.o.   MRN: [id number]  HPI: Sandra Jensen is a 51 y.o. female  Chief Complaint  Patient presents with   Insomnia    Patient states she is having trouble falling asleep and staying asleep    INSOMNIA Duration: chronic Satisfied with sleep quality: no Difficulty falling asleep: yes Difficulty staying asleep: yes Waking a few hours after sleep onset: yes Early morning awakenings: yes Daytime hypersomnolence: yes Wakes feeling refreshed: no Good sleep hygiene: yes Apnea: no Snoring: no Depressed/anxious mood: yes Recent stress: yes Restless legs/nocturnal leg cramps: no Chronic pain/arthritis: yes History of sleep study: yes Treatments attempted: melatonin, uinsom, and benadryl    Relevant past medical, surgical, family and social history reviewed and updated as indicated. Interim medical history since our last visit reviewed. Allergies and medications reviewed and updated.  Review of Systems  Constitutional: Negative.   Respiratory: Negative.    Cardiovascular: Negative.   Gastrointestinal: Negative.   Musculoskeletal: Negative.   Skin: Negative.   Neurological: Negative.   Psychiatric/Behavioral:  Positive for sleep disturbance. Negative for agitation, behavioral problems, confusion, decreased concentration, dysphoric mood, hallucinations, self-injury and suicidal ideas. The patient is not nervous/anxious and is not hyperactive.    Per HPI unless specifically indicated above     Objective:    BP 119/79    Pulse 85    Temp 97.9 F (36.6 C)    Wt 208 lb 3.2 oz (94.4 kg)    SpO2 99%    BMI 33.60 kg/m   Wt Readings from Last 3 Encounters:  08/12/21 208 lb 3.2 oz (94.4 kg)  08/09/20 186 lb (84.4 kg)  02/23/20 194 lb (88 kg)    Physical Exam Vitals and nursing note reviewed.   Constitutional:      General: She is not in acute distress.    Appearance: Normal appearance. She is obese. She is not ill-appearing, toxic-appearing or diaphoretic.  HENT:     Head: Normocephalic and atraumatic.     Right Ear: External ear normal.     Left Ear: External ear normal.     Nose: Nose normal.     Mouth/Throat:     Mouth: Mucous membranes are moist.     Pharynx: Oropharynx is clear.  Eyes:     General: No scleral icterus.       Right eye: No discharge.        Left eye: No discharge.     Extraocular Movements: Extraocular movements intact.     Conjunctiva/sclera: Conjunctivae normal.     Pupils: Pupils are equal, round, and reactive to light.  Cardiovascular:     Rate and Rhythm: Normal rate and regular rhythm.     Pulses: Normal pulses.     Heart sounds: Normal heart sounds. No murmur heard.   No friction rub. No gallop.  Pulmonary:     Effort: Pulmonary effort is normal. No respiratory distress.     Breath sounds: Normal breath sounds. No stridor. No wheezing, rhonchi or rales.  Chest:     Chest wall: No tenderness.  Musculoskeletal:        General: Normal range of motion.     Cervical back: Normal  range of motion and neck supple.  Skin:    General: Skin is warm and dry.     Capillary Refill: Capillary refill takes less than 2 seconds.     Coloration: Skin is not jaundiced or pale.     Findings: No bruising, erythema, lesion or rash.  Neurological:     General: No focal deficit present.     Mental Status: She is alert and oriented to person, place, and time. Mental status is at baseline.  Psychiatric:        Mood and Affect: Mood normal.        Behavior: Behavior normal.        Thought Content: Thought content normal.        Judgment: Judgment normal.    Results for orders placed or performed in visit on 02/23/20  Microscopic Examination   Urine  Result Value Ref Range   WBC, UA 6-10 (A) 0 - 5 /hpf   RBC 0-2 0 - 2 /hpf   Epithelial Cells (non renal)  0-10 0 - 10 /hpf   Casts Present None seen /lpf   Cast Type Hyaline casts N/A   Mucus, UA Present Not Estab.   Bacteria, UA Many (A) None seen/Few  Comprehensive metabolic panel  Result Value Ref Range   Glucose 93 65 - 99 mg/dL   BUN 4 (L) 6 - 24 mg/dL   Creatinine, Ser 0.66 0.57 - 1.00 mg/dL   GFR calc non Af Amer 105 >59 mL/min/1.73   GFR calc Af Amer 121 >59 mL/min/1.73   BUN/Creatinine Ratio 6 (L) 9 - 23   Sodium 137 134 - 144 mmol/L   Potassium 3.2 (L) 3.5 - 5.2 mmol/L   Chloride 103 96 - 106 mmol/L   CO2 23 20 - 29 mmol/L   Calcium 9.0 8.7 - 10.2 mg/dL   Total Protein 6.8 6.0 - 8.5 g/dL   Albumin 4.1 3.8 - 4.8 g/dL   Globulin, Total 2.7 1.5 - 4.5 g/dL   Albumin/Globulin Ratio 1.5 1.2 - 2.2   Bilirubin Total 0.5 0.0 - 1.2 mg/dL   Alkaline Phosphatase 153 (H) 48 - 121 IU/L   AST 13 0 - 40 IU/L   ALT 23 0 - 32 IU/L  CBC with Differential/Platelet  Result Value Ref Range   WBC 7.4 3.4 - 10.8 x10E3/uL   RBC 4.37 3.77 - 5.28 x10E6/uL   Hemoglobin 11.7 11.1 - 15.9 g/dL   Hematocrit 37.9 34.0 - 46.6 %   MCV 87 79 - 97 fL   MCH 26.8 26.6 - 33.0 pg   MCHC 30.9 (L) 31.5 - 35.7 g/dL   RDW 13.2 11.7 - 15.4 %   Platelets 372 150 - 450 x10E3/uL   Neutrophils 70 Not Estab. %   Lymphs 18 Not Estab. %   Monocytes 6 Not Estab. %   Eos 5 Not Estab. %   Basos 1 Not Estab. %   Neutrophils Absolute 5.1 1.4 - 7.0 x10E3/uL   Lymphocytes Absolute 1.3 0.7 - 3.1 x10E3/uL   Monocytes Absolute 0.5 0.1 - 0.9 x10E3/uL   EOS (ABSOLUTE) 0.4 0.0 - 0.4 x10E3/uL   Basophils Absolute 0.1 0.0 - 0.2 x10E3/uL   Immature Granulocytes 0 Not Estab. %   Immature Grans (Abs) 0.0 0.0 - 0.1 x10E3/uL  TSH  Result Value Ref Range   TSH 3.620 0.450 - 4.500 uIU/mL  Lyme Ab/Western Blot Reflex  Result Value Ref Range   Lyme IgG/IgM Ab <0.91 0.00 - 0.90  ISR   LYME DISEASE AB, QUANT, IGM 1.28 (H) 0.00 - 0.79 index  Rocky mtn spotted fvr abs pnl(IgG+IgM)  Result Value Ref Range   RMSF IgG Negative Negative    RMSF IgM 1.02 (H) 0.00 - [id number] index  Ehrlichia Antibody Panel  Result Value Ref Range   E.Chaffeensis (HME) IgG Negative Neg:<1:64   E. Chaffeensis (HME) IgM Titer Negative Neg:<1:20   HGE IgG Titer Negative Neg:<1:64   HGE IgM Titer Negative Neg:<1:20  Babesia microti Antibody Panel  Result Value Ref Range   Babesia microti IgM <1:10 Neg:<1:10   Babesia microti IgG <1:10 Neg:<1:10  [id number] 11+Oxyco+Alc+Crt-Bund  Result Value Ref Range   Ethanol Negative Cutoff=0.020 %   Amphetamines, Urine See Final Results Cutoff=1000 ng/mL   Barbiturate Negative Cutoff=200 ng/mL   BENZODIAZ UR QL See Final Results Cutoff=200 ng/mL   Cannabinoid Quant, Ur Negative Cutoff=50 ng/mL   Cocaine (Metabolite) Negative Cutoff=300 ng/mL   OPIATE SCREEN URINE See Final Results Cutoff=300 ng/mL   Oxycodone/Oxymorphone, Urine See Final Results Cutoff=300 ng/mL   Phencyclidine Negative Cutoff=25 ng/mL   Methadone Screen, Urine Negative Cutoff=300 ng/mL   Propoxyphene Negative Cutoff=300 ng/mL   Meperidine Negative Cutoff=200 ng/mL   Tramadol Negative Cutoff=200 ng/mL   Creatinine 441.5 (H) 20.0 - 300.0 mg/dL   pH, Urine 5.6 4.5 - 8.9  Urinalysis, Routine w reflex microscopic  Result Value Ref Range   Specific Gravity, UA >1.030 (H) 1.005 - 1.030   pH, UA 5.5 5.0 - 7.5   Color, UA Yellow Yellow   Appearance Ur Clear Clear   Leukocytes,UA Negative Negative   Protein,UA 2+ (A) Negative/Trace   Glucose, UA Negative Negative   Ketones, UA Negative Negative   RBC, UA Trace (A) Negative   Bilirubin, UA Negative Negative   Urobilinogen, Ur 0.2 0.2 - 1.0 mg/dL   Nitrite, UA Negative Negative   Microscopic Examination See below:   Lyme, Western Blot, Serum (reflexed)  Result Value Ref Range     IgG P93 Ab. Absent      IgG P66 Ab. Absent      IgG P58 Ab. Absent      IgG P45 Ab. Absent      IgG P41 Ab. Present (A)      IgG P39 Ab. Absent      IgG P30 Ab. Absent      IgG P28 Ab. Absent      IgG P23 Ab.  Absent      IgG P18 Ab. Absent    Lyme IgG Wb Negative      IgM P41 Ab. Absent      IgM P39 Ab. Absent      IgM P23 Ab. Absent    Lyme IgM Wb Negative   Drug Profile [id number]  Result Value Ref Range   Amphetamines Positive (A) Cutoff=1000   Amphetamine Positive (A)    Amphetamine GC/MS Conf >3000 Cutoff=500 ng/mL   Methamphetamine Negative Cutoff=500  Benzodiazepines Confirm, Urine  Result Value Ref Range   Benzodiazepines Positive (A) Cutoff=100 ng/mL   Nordiazepam Positive (A)    Nordiazepam Conf 171 Cutoff=100 ng/mL   Oxazepam Positive (A)    Oxazepam Conf 1,987 Cutoff=100 ng/mL   Flurazepam Negative Cutoff=100   Lorazepam Negative Cutoff=100   Alprazolam Negative Cutoff=100   Clonazepam Positive (A)    Clonazepam Conf. 949 Cutoff=100 ng/mL   Temazepam Positive (A)    Temazepam Conf. 554 Cutoff=100 ng/mL   Triazolam Negative Cutoff=100   Midazolam Negative Cutoff=100  Opiates  Confirmation, Urine  Result Value Ref Range   Opiates Negative Cutoff=300 ng/mL  Oxycodone/Oxymorphone, Confirm  Result Value Ref Range   OXYCODONE/OXYMORPH Positive (A) Cutoff=300   OXYCODONE Positive (A)    OXYCODONE >3000 Cutoff=300 ng/mL   OXYMORPHONE Positive (A)    OXYMORPHONE (GC/MS) >3000 Cutoff=300 ng/mL      Assessment & Plan:   Problem List Items Addressed This Visit       Respiratory   COPD (chronic obstructive pulmonary disease) ([place])    Under good control on current regimen. Continue current regimen. Continue to monitor. Call with any concerns. Refills given. Labs drawn today.       Relevant Medications   budesonide-formoterol (SYMBICORT) 80-4.5 MCG/ACT inhaler   albuterol (VENTOLIN HFA) 108 (90 Base) MCG/ACT inhaler     Other   Bipolar disorder [facility])    Following with psychiatry. Stable except for insomnia. Will reach out to them about adding in some seroquel. Continue to follow with them.       Relevant Orders   CBC with Differential/Platelet   Comprehensive  metabolic panel   Lipid Panel w/o Chol/HDL Ratio   Lithium level   Major depressive disorder    Following with psychiatry. Stable except for insomnia. Will reach out to them about adding in some seroquel. Continue to follow with them.       Relevant Medications   FLUoxetine (PROZAC) 40 MG capsule   Other Relevant Orders   CBC with Differential/Platelet   Comprehensive metabolic panel   Urinalysis, Routine w reflex microscopic   Vitamin B12 deficiency    Rechecking labs today. Await results. Treat as needed.       Relevant Orders   CBC with Differential/Platelet   Comprehensive metabolic panel   123456   Class 1 obesity due to excess calories without serious comorbidity with body mass index (BMI) of 33.0 to 33.9 in adult    Encouraged diet and exercise with goal of losing 1-2lbs per week. Call with any concerns or if not getting better.       Relevant Orders   CBC with Differential/Platelet   TSH   Bayer DCA Hb A1c Waived   Other Visit Diagnoses     Insomnia, unspecified type    -  Primary   Reached out to psychiatry about short course of seroquel until she follows up with them. She is on significant medication- follow up with them 2/21 as scheduled   Chronic fatigue       Relevant Orders   CBC with Differential/Platelet   TSH   Screening for colon cancer       Referral to GI made today.    Relevant Orders   Ambulatory referral to Gastroenterology   Encounter for screening mammogram for malignant neoplasm of breast       Relevant Orders   MM DIAG BREAST TOMO BILATERAL        Follow up plan: Return in about 6 months (around 02/09/2022) for physical.

## 2021-08-12 NOTE — Patient Instructions (Signed)
Please call to schedule your mammogram and/or bone density: °[facility]  °Address: [address], [place], [place] [address]  °Phone: [phone] ° °

## 2021-08-12 NOTE — Assessment & Plan Note (Signed)
Following with psychiatry. Stable except for insomnia. Will reach out to them about adding in some seroquel. Continue to follow with them.

## 2021-08-12 NOTE — Assessment & Plan Note (Signed)
Following with psychiatry. Stable except for insomnia. Will reach out to them about adding in some seroquel. Continue to follow with them.  °

## 2021-08-12 NOTE — Assessment & Plan Note (Signed)
Encouraged diet and exercise with goal of losing 1-2lbs per week. Call with any concerns or if not getting better.

## 2021-08-13 LAB — CBC WITH DIFFERENTIAL/PLATELET
Basophils Absolute: 0 10*3/uL (ref 0.0–0.2)
Basos: 1 %
EOS (ABSOLUTE): 0.2 10*3/uL (ref 0.0–0.4)
Eos: 5 %
Hematocrit: 37 % (ref 34.0–46.6)
Hemoglobin: 11.8 g/dL (ref 11.1–15.9)
Immature Grans (Abs): 0.1 10*3/uL (ref 0.0–0.1)
Immature Granulocytes: 1 %
Lymphocytes Absolute: 1.4 10*3/uL (ref 0.7–3.1)
Lymphs: 28 %
MCH: 26.1 pg — ABNORMAL LOW (ref 26.6–33.0)
MCHC: 31.9 g/dL (ref 31.5–35.7)
MCV: 82 fL (ref 79–97)
Monocytes Absolute: 0.3 10*3/uL (ref 0.1–0.9)
Monocytes: 7 %
Neutrophils Absolute: 2.9 10*3/uL (ref 1.4–7.0)
Neutrophils: 58 %
Platelets: 282 10*3/uL (ref 150–450)
RBC: 4.52 x10E6/uL (ref 3.77–5.28)
RDW: 14 % (ref 11.7–15.4)
WBC: 5 10*3/uL (ref 3.4–10.8)

## 2021-08-13 LAB — COMPREHENSIVE METABOLIC PANEL
ALT: 22 IU/L (ref 0–32)
AST: 22 IU/L (ref 0–40)
Albumin/Globulin Ratio: 2 (ref 1.2–2.2)
Albumin: 4.3 g/dL (ref 3.8–4.8)
Alkaline Phosphatase: 79 IU/L (ref 44–121)
BUN/Creatinine Ratio: 16 (ref 9–23)
BUN: 10 mg/dL (ref 6–24)
Bilirubin Total: 0.2 mg/dL (ref 0.0–1.2)
CO2: 25 mmol/L (ref 20–29)
Calcium: 9.2 mg/dL (ref 8.7–10.2)
Chloride: 106 mmol/L (ref 96–106)
Creatinine, Ser: 0.63 mg/dL (ref 0.57–1.00)
Globulin, Total: 2.2 g/dL (ref 1.5–4.5)
Glucose: 90 mg/dL (ref 70–99)
Potassium: 4.5 mmol/L (ref 3.5–5.2)
Sodium: 143 mmol/L (ref 134–144)
Total Protein: 6.5 g/dL (ref 6.0–8.5)
eGFR: 109 mL/min/{1.73_m2} (ref >59)

## 2021-08-13 LAB — VITAMIN B12: Vitamin B-12: 247 pg/mL (ref 232–1245)

## 2021-08-13 LAB — LIPID PANEL W/O CHOL/HDL RATIO
Cholesterol, Total: 237 mg/dL — ABNORMAL HIGH (ref 100–199)
HDL: 51 mg/dL (ref >39)
LDL Chol Calc (NIH): 162 mg/dL — ABNORMAL HIGH (ref 0–99)
Triglycerides: 135 mg/dL (ref 0–149)
VLDL Cholesterol Cal: 24 mg/dL (ref 5–40)

## 2021-08-13 LAB — TSH: TSH: 1.63 u[IU]/mL (ref 0.450–4.500)

## 2021-08-13 LAB — LITHIUM LEVEL: Lithium Lvl: 0.2 mmol/L — ABNORMAL LOW (ref 0.5–1.2)

## 2021-08-15 ENCOUNTER — Other Ambulatory Visit: Payer: Self-pay | Admitting: Family Medicine

## 2021-08-15 DIAGNOSIS — E785 Hyperlipidemia, unspecified: Secondary | ICD-10-CM | POA: Insufficient documentation

## 2021-08-15 DIAGNOSIS — E782 Mixed hyperlipidemia: Secondary | ICD-10-CM

## 2021-08-15 MED ORDER — VITAMIN B-12 1000 MCG PO TABS: 1000.0000 ug | ORAL_TABLET | Freq: Every day | ORAL | 3 refills | Status: AC

## 2021-08-16 ENCOUNTER — Ambulatory Visit: Payer: Self-pay

## 2021-08-17 ENCOUNTER — Telehealth: Payer: Self-pay

## 2021-08-17 NOTE — Telephone Encounter (Signed)
CALLED PATIENT NO ANSWER LEFT VOICEMAIL FOR A CALL BACK °Letter sent °

## 2021-09-12 ENCOUNTER — Telehealth: Payer: Self-pay

## 2021-09-12 DIAGNOSIS — Z5900 Homelessness unspecified: Secondary | ICD-10-CM

## 2021-09-12 DIAGNOSIS — F4321 Adjustment disorder with depressed mood: Secondary | ICD-10-CM

## 2021-09-12 DIAGNOSIS — F3160 Bipolar disorder, current episode mixed, unspecified: Secondary | ICD-10-CM

## 2021-09-12 DIAGNOSIS — F339 Major depressive disorder, recurrent, unspecified: Secondary | ICD-10-CM
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
## 2021-09-18 NOTE — Addendum Note (Signed)
Addended by: Dorcas Carrow on: 09/18/2021 03:38 PM ? ? Modules accepted: Orders ? ?

## 2021-09-19 ENCOUNTER — Telehealth: Payer: Self-pay

## 2021-09-19 NOTE — Chronic Care Management (AMB) (Signed)
?  Chronic Care Management  ? ?Outreach Note ? ?09/19/2021 ?Name: Sandra Jensen MRN: [id number] DOB: April 26, 1971 ? ?Sandra Jensen is a 51 y.o. year old female who is a primary care patient of Dorcas Carrow, DO. I reached out to Estill Dooms by phone today in response to a referral sent by [name]'s primary care provider. ? ?An unsuccessful telephone outreach was attempted today. The patient was referred to the case management team for assistance with care management and care coordination.  ? ?Follow Up Plan: A HIPAA compliant phone message was left for the patient providing contact information and requesting a return call.  ?The care management team will reach out to the patient again over the next 3 days.  ?If patient returns call to provider office, please advise to call Embedded Care Management Care Guide Penne Lash * at [phone]* ? ?Penne Lash, RMA ?Care Guide, Embedded Care Coordination ?[facility]  ?[place], [place] [address] ?Direct Dial: [phone] ?[email or web address] ?Website: [email or web address]  ? ?

## 2021-09-22 NOTE — Chronic Care Management (AMB) (Signed)
?  Care Management  ? ?Outreach Note ? ?09/22/2021 ?Name: Sandra Jensen MRN: [id number] DOB: 18-Sep-1970 ? ?Referred by: Valerie Roys, DO ?Reason for referral : Chronic Care Management (Outreach to schedule with LCSW Per Dr. Wynetta Emery request ) ? ? ?A second unsuccessful telephone outreach was attempted today. The patient was referred to the case management team for assistance with care management and care coordination.  ? ?Follow Up Plan:  ?A HIPAA compliant phone message was left for the patient providing contact information and requesting a return call.  ?The care management team will reach out to the patient again over the next 5 days.  ?If patient returns call to provider office, please advise to call [place] * at [phone]* ? ?Noreene Larsson, RMA ?Care Guide, Embedded Care Coordination ?[facility]  ?[place], [place] [address] ?Direct Dial: [phone] ?[email or web address] ?Website: [email or web address]  ?\ ?

## 2021-09-23 ENCOUNTER — Telehealth: Payer: Self-pay | Admitting: *Deleted

## 2021-09-23 NOTE — Telephone Encounter (Signed)
?  Care Management  ? ?Follow Up Note ? ? ?09/23/2021 ?Name: Sandra Jensen MRN: [id number] DOB: 05-Feb-1971 ? ? ?Referred by: Valerie Roys, DO ?Reason for referral : No chief complaint on file. ? ? ?An unsuccessful telephone outreach was attempted today. The patient was referred to the case management team for assistance with care management and care coordination.  ? ?Follow Up Plan: The care management team will reach out to the patient again over the next 5 days.  ? ?Eduard Clos MSW, LCSW ?Licensed Clinical Social Worker ?CCM Coverage [facility]    ?[phone]  ? ?

## 2021-09-27 NOTE — Chronic Care Management (AMB) (Signed)
?  Care Management  ? ?Outreach Note ? ?09/27/2021 ?Name: Sandra Jensen MRN: [id number] DOB: 04-16-1971 ? ?Referred by: Dorcas Carrow, DO ?Reason for referral : Chronic Care Management (Outreach to schedule with LCSW Per Dr. Laural Benes request ) ? ? ?Third unsuccessful telephone outreach was attempted today. The patient was referred to the case management team for assistance with care management and care coordination. The patient's primary care provider has been notified of our unsuccessful attempts to make or maintain contact with the patient. The care management team is pleased to engage with this patient at any time in the future should he/she be interested in assistance from the care management team.  ? ?Follow Up Plan:  ?We have been unable to make contact with the patient for follow up. The care management team is available to follow up with the patient after provider conversation with the patient regarding recommendation for care management engagement and subsequent re-referral to the care management team.  ? ?Penne Lash, RMA ?Care Guide, Embedded Care Coordination ?[facility]  ?[place], [place] [address] ?Direct Dial: [phone] ?[email or web address] ?Website: [email or web address]  ? ?

## 2021-09-28 ENCOUNTER — Telehealth: Payer: Self-pay | Admitting: *Deleted

## 2021-09-28 NOTE — Telephone Encounter (Signed)
?  Care Management  ? ?Follow Up Note ? ? ?09/28/2021 ?Name: Sandra Jensen MRN: [id number] DOB: August 10, 1970 ? ? ?Referred by: Valerie Roys, DO ?Reason for referral : No chief complaint on file. ? ? ?A second unsuccessful telephone outreach was attempted today. The patient was referred to the case management team for assistance with care management and care coordination.  ? ?Follow Up Plan: The care management team will reach out to the patient again over the next 7 days.  ?Eduard Clos MSW, LCSW ?Licensed Clinical Social Worker ?[facility] Coverage    ?[phone]  ?

## 2021-09-28 NOTE — Telephone Encounter (Signed)
Referral has been closed due to 3 unsuccessful outreaches by my self and also 2 unsuccessful outreach by LCSW  ?

## 2021-09-30 ENCOUNTER — Telehealth: Payer: Self-pay | Admitting: *Deleted

## 2021-09-30 NOTE — Telephone Encounter (Signed)
?  Care Management  ? ?Follow Up Note ? ? ?09/30/2021 ?Name: DAUNE DIVIRGILIO MRN: [id number] DOB: 01/24/1971 ? ? ?Referred by: Dorcas Carrow, DO ?Reason for referral : No chief complaint on file. ? ? ?Third unsuccessful telephone outreach was attempted today. The patient was referred to the case management team for assistance with care management and care coordination. The patient's primary care provider has been notified of our unsuccessful attempts to make or maintain contact with the patient. The care management team is pleased to engage with this patient at any time in the future should he/she be interested in assistance from the care management team.  and   ? ?Follow Up Plan: No further follow up required:   ? ?Reece Levy MSW, LCSW ?Licensed Clinical Social Worker ?CCM Coverage      ?[phone]  ? ?

## 2021-10-05 ENCOUNTER — Other Ambulatory Visit: Payer: Self-pay | Admitting: Family Medicine

## 2021-10-05 NOTE — Telephone Encounter (Signed)
Requested Prescriptions  ?Pending Prescriptions Disp Refills  ?? albuterol (VENTOLIN HFA) 108 (90 Base) MCG/ACT inhaler [Pharmacy Med Name: ALBUTEROL HFA (VENTOLIN) INH] 18 each 1  ?  Sig: INHALE 1-2 PUFFS BY MOUTH EVERY 6 HOURS AS NEEDED FOR WHEEZE OR SHORTNESS OF BREATH  ?  ? Pulmonology:  Beta Agonists 2 Passed - 10/05/2021  2:00 AM  ?  ?  Passed - Last BP in normal range  ?  BP Readings from Last 1 Encounters:  ?08/12/21 119/79  ?   ?  ?  Passed - Last Heart Rate in normal range  ?  Pulse Readings from Last 1 Encounters:  ?08/12/21 85  ?   ?  ?  Passed - Valid encounter within last 12 months  ?  Recent Outpatient Visits   ?      ? 1 month ago Insomnia, unspecified type  ? [facility] [place], Megan P, DO  ? 1 year ago Hypersomnolence  ? [facility] [place], [place] P, DO  ? 1 year ago Acute bronchitis with COPD (HCC)  ? [facility] Cathlean Marseilles A, NP  ? 1 year ago Acute bronchitis with COPD (HCC)  ? [facility] Cathlean Marseilles A, NP  ? 2 years ago Chronic pain of left knee  ? [facility] Valentino Nose, NP  ?  ?  ? ?  ?  ?  ? ? ?

## 2021-10-06 NOTE — Telephone Encounter (Signed)
Requested medication (s) are due for refill today: No ? ?Requested medication (s) are on the active medication list: Yes ? ?Last refill:  10/05/21 ? ?Future visit scheduled: No ? ?Notes to clinic:  Pharmacy asking for alternative. ? ? ? ?Requested Prescriptions  ?Pending Prescriptions Disp Refills  ? albuterol (VENTOLIN HFA) 108 (90 Base) MCG/ACT inhaler [Pharmacy Med Name: ALBUTEROL HFA (VENTOLIN) INH] 18 each 1  ?  Sig: INHALE 1-2 PUFFS BY MOUTH EVERY 6 HOURS AS NEEDED FOR WHEEZE OR SHORTNESS OF BREATH  ?  ? Pulmonology:  Beta Agonists 2 Passed - 10/05/2021 12:28 PM  ?  ?  Passed - Last BP in normal range  ?  BP Readings from Last 1 Encounters:  ?08/12/21 119/79  ?  ?  ?  ?  Passed - Last Heart Rate in normal range  ?  Pulse Readings from Last 1 Encounters:  ?08/12/21 85  ?  ?  ?  ?  Passed - Valid encounter within last 12 months  ?  Recent Outpatient Visits   ? ?      ? 1 month ago Insomnia, unspecified type  ? [facility] [place], Megan P, DO  ? 1 year ago Hypersomnolence  ? [facility] [place], [place] P, DO  ? 1 year ago Acute bronchitis with COPD (HCC)  ? [facility] Cathlean Marseilles A, NP  ? 1 year ago Acute bronchitis with COPD (HCC)  ? [facility] Cathlean Marseilles A, NP  ? 2 years ago Chronic pain of left knee  ? [facility] Valentino Nose, NP  ? ?  ?  ? ?  ?  ?  ? ?

## 2021-10-06 NOTE — Telephone Encounter (Signed)
I called in the generic- please have them fill whatever is covered ?

## 2021-11-08 ENCOUNTER — Other Ambulatory Visit: Payer: Self-pay | Admitting: Family Medicine

## 2021-11-08 DIAGNOSIS — J209 Acute bronchitis, unspecified: Secondary | ICD-10-CM

## 2021-11-09 NOTE — Telephone Encounter (Signed)
Requested Prescriptions  ?Pending Prescriptions Disp Refills  ?? SYMBICORT 80-4.5 MCG/ACT inhaler [Pharmacy Med Name: SYMBICORT 80-4.5 MCG INHALER] 10.2 each 2  ?  Sig: INHALE 2 PUFFS INTO THE LUNGS TWICE A DAY  ?  ? Pulmonology:  Combination Products Passed - 11/08/2021  1:57 AM  ?  ?  Passed - Valid encounter within last 12 months  ?  Recent Outpatient Visits   ?      ? 2 months ago Insomnia, unspecified type  ? [facility] [place], Megan P, DO  ? 1 year ago Hypersomnolence  ? [facility] [place], [place] P, DO  ? 1 year ago Acute bronchitis with COPD (HCC)  ? [facility] Cathlean Marseilles A, NP  ? 1 year ago Acute bronchitis with COPD (HCC)  ? [facility] Cathlean Marseilles A, NP  ? 2 years ago Chronic pain of left knee  ? [facility] Valentino Nose, NP  ?  ?  ? ?  ?  ?  ? ?

## 2021-12-02 ENCOUNTER — Other Ambulatory Visit: Payer: Self-pay | Admitting: Family Medicine

## 2021-12-02 NOTE — Telephone Encounter (Signed)
Requested Prescriptions  Pending Prescriptions Disp Refills  . albuterol (VENTOLIN HFA) 108 (90 Base) MCG/ACT inhaler [Pharmacy Med Name: ALBUTEROL HFA (PROAIR) INHALER] 18 each 1    Sig: INHALE 1-2 PUFFS BY MOUTH EVERY 6 HOURS AS NEEDED FOR WHEEZE OR SHORTNESS OF BREATH     Pulmonology:  Beta Agonists 2 Passed - 12/02/2021  1:56 AM      Passed - Last BP in normal range    BP Readings from Last 1 Encounters:  08/12/21 119/79         Passed - Last Heart Rate in normal range    Pulse Readings from Last 1 Encounters:  08/12/21 85         Passed - Valid encounter within last 12 months    Recent Outpatient Visits          3 months ago Insomnia, unspecified type   [facility] [place], [place], DO   1 year ago Hypersomnolence   [facility] [place], [place], DO   1 year ago Acute bronchitis with COPD (HCC)   [facility] Valentino Nose, NP   1 year ago Acute bronchitis with COPD (HCC)   [facility] Valentino Nose, NP   2 years ago Chronic pain of left knee   [facility] Valentino Nose, NP

## 2021-12-05 ENCOUNTER — Other Ambulatory Visit: Payer: Self-pay

## 2021-12-05 ENCOUNTER — Emergency Department (HOSPITAL_BASED_OUTPATIENT_CLINIC_OR_DEPARTMENT_OTHER): Payer: 59 | Admitting: Radiology

## 2021-12-05 ENCOUNTER — Encounter (HOSPITAL_BASED_OUTPATIENT_CLINIC_OR_DEPARTMENT_OTHER): Payer: Self-pay | Admitting: Emergency Medicine

## 2021-12-05 ENCOUNTER — Emergency Department (HOSPITAL_BASED_OUTPATIENT_CLINIC_OR_DEPARTMENT_OTHER)
Admission: EM | Admit: 2021-12-05 | Discharge: 2021-12-05 | Disposition: A | Discharge status: Home / Self Care | Payer: 59 | Attending: Emergency Medicine | Admitting: Emergency Medicine

## 2021-12-05 DIAGNOSIS — E876 Hypokalemia: Secondary | ICD-10-CM | POA: Diagnosis not present

## 2021-12-05 DIAGNOSIS — Z20822 Contact with and (suspected) exposure to covid-19: Secondary | ICD-10-CM | POA: Insufficient documentation

## 2021-12-05 DIAGNOSIS — R0602 Shortness of breath: Secondary | ICD-10-CM | POA: Diagnosis present

## 2021-12-05 DIAGNOSIS — J449 Chronic obstructive pulmonary disease, unspecified: Secondary | ICD-10-CM | POA: Diagnosis not present

## 2021-12-05 LAB — BASIC METABOLIC PANEL
Anion gap: 9 (ref 5–15)
BUN: 14 mg/dL (ref 6–20)
CO2: 24 mmol/L (ref 22–32)
Calcium: 8.9 mg/dL (ref 8.9–10.3)
Chloride: 108 mmol/L (ref 98–111)
Creatinine, Ser: 0.53 mg/dL (ref 0.44–1.00)
GFR, Estimated: >60 mL/min (ref >60)
Glucose, Bld: 95 mg/dL (ref 70–99)
Potassium: 3.1 mmol/L — ABNORMAL LOW (ref 3.5–5.1)
Sodium: 141 mmol/L (ref 135–145)

## 2021-12-05 LAB — CBC WITH DIFFERENTIAL/PLATELET
Abs Immature Granulocytes: 0.03 10*3/uL (ref 0.00–0.07)
Basophils Absolute: 0.1 10*3/uL (ref 0.0–0.1)
Basophils Relative: 1 %
Eosinophils Absolute: 0.4 10*3/uL (ref 0.0–0.5)
Eosinophils Relative: 4 %
HCT: 38.7 % (ref 36.0–46.0)
Hemoglobin: 12.4 g/dL (ref 12.0–15.0)
Immature Granulocytes: 0 %
Lymphocytes Relative: 28 %
Lymphs Abs: 2.4 10*3/uL (ref 0.7–4.0)
MCH: 26.6 pg (ref 26.0–34.0)
MCHC: 32 g/dL (ref 30.0–36.0)
MCV: 83 fL (ref 80.0–100.0)
Monocytes Absolute: 0.5 10*3/uL (ref 0.1–1.0)
Monocytes Relative: 6 %
Neutro Abs: 5.1 10*3/uL (ref 1.7–7.7)
Neutrophils Relative %: 61 %
Platelets: 278 10*3/uL (ref 150–400)
RBC: 4.66 MIL/uL (ref 3.87–5.11)
RDW: 16 % — ABNORMAL HIGH (ref 11.5–15.5)
WBC: 8.4 10*3/uL (ref 4.0–10.5)
nRBC: 0 % (ref 0.0–0.2)

## 2021-12-05 LAB — SARS CORONAVIRUS 2 BY RT PCR: SARS Coronavirus 2 by RT PCR: NEGATIVE

## 2021-12-05 LAB — TROPONIN I (HIGH SENSITIVITY)
Troponin I (High Sensitivity): <2 ng/L (ref <18)
Troponin I (High Sensitivity): 2 ng/L (ref <18)

## 2021-12-05 MED ORDER — PREDNISONE 50 MG PO TABS
60.0000 mg | ORAL_TABLET | Freq: Once | ORAL | Status: AC
  Administered 2021-12-05: 60 mg via ORAL
  Filled 2021-12-05: qty 1

## 2021-12-05 MED ORDER — PREDNISONE 20 MG PO TABS: ORAL_TABLET | ORAL | 0 refills | Status: DC

## 2021-12-05 MED ORDER — POTASSIUM CHLORIDE CRYS ER 20 MEQ PO TBCR
40.0000 meq | EXTENDED_RELEASE_TABLET | Freq: Once | ORAL | Status: AC
  Administered 2021-12-05: 40 meq via ORAL
  Filled 2021-12-05: qty 2

## 2021-12-05 MED ORDER — IPRATROPIUM-ALBUTEROL 0.5-2.5 (3) MG/3ML IN SOLN
3.0000 mL | Freq: Once | RESPIRATORY_TRACT | Status: AC
Start: 2021-12-05 — End: 2021-12-05
  Administered 2021-12-05: 3 mL via RESPIRATORY_TRACT
  Filled 2021-12-05: qty 3

## 2021-12-05 NOTE — ED Provider Notes (Signed)
[facility] EMERGENCY DEPT Provider Note   CSN: [id number] Arrival date & time: 12/05/21  0038     History  Chief Complaint  Patient presents with   Shortness of Breath    Sandra Jensen is a 51 y.o. female.  The history is provided by the patient.  Shortness of Breath Severity:  Moderate Onset quality:  Gradual Duration:  4 weeks Timing:  Constant Progression:  Waxing and waning Chronicity:  New Context: not fumes, not pollens and not weather changes   Relieved by:  Nothing Worsened by:  Nothing Ineffective treatments:  None tried Associated symptoms: cough and wheezing   Associated symptoms: no fever, no sore throat, no sputum production and no syncope   Risk factors: no hx of PE/DVT and no obesity        Home Medications Prior to Admission medications   Medication Sig Start Date End Date Taking? Authorizing Provider  predniSONE (DELTASONE) 20 MG tablet 3 tabs po day one, then 2 po daily x 4 days 12/05/21  Yes Darielle Hancher, MD  albuterol (VENTOLIN HFA) 108 (90 Base) MCG/ACT inhaler INHALE 1-2 PUFFS BY MOUTH EVERY 6 HOURS AS NEEDED FOR WHEEZE OR SHORTNESS OF BREATH 12/02/21   Johnson, Megan P, DO  clonazePAM (KLONOPIN) 0.5 MG tablet Take 1 tablet (0.5 mg total) by mouth daily as needed for anxiety. 01/19/20 01/18/21  Arfeen, Phillips Grout, MD  cyclobenzaprine (FLEXERIL) 10 MG tablet Take 1 tablet (10 mg total) by mouth daily as needed for muscle spasms. Patient not taking: Reported on 08/09/2020 01/05/20   Valentino Nose, NP  FLUoxetine (PROZAC) 40 MG capsule Take 2 capsules by mouth daily.    [provider]  fluticasone (FLONASE) 50 MCG/ACT nasal spray Place 1 spray into both nostrils daily. 01/05/20   Valentino Nose, NP  haloperidol (HALDOL) 5 MG tablet Take 5 mg by mouth 2 (two) times daily as needed. 07/21/21   [provider]  lithium 300 MG tablet Take 300 mg by mouth 3 (three) times daily as needed. 07/21/21   [provider]  Oxycodone HCl 10 MG TABS Take 10 mg by mouth every 6 (six) hours as needed. 07/20/21   [provider]  SYMBICORT 80-4.5 MCG/ACT inhaler INHALE 2 PUFFS INTO THE LUNGS TWICE A DAY 11/09/21   Johnson, Megan P, DO  vitamin B-12 (CYANOCOBALAMIN) 1000 MCG tablet Take 1 tablet (1,000 mcg total) by mouth daily. 08/15/21   Olevia Perches P, DO      Allergies    Morphine and related, Acetaminophen, Cephalexin, and Vicodin [hydrocodone-acetaminophen]    Review of Systems   Review of Systems  Constitutional:  Negative for fever.  HENT:  Negative for sore throat.   Respiratory:  Positive for cough, shortness of breath and wheezing. Negative for sputum production.   Cardiovascular:  Negative for syncope.    Physical Exam Updated Vital Signs BP 130/74   Pulse 75   Temp 97.8 F (36.6 C) (Oral)   Resp 18   Ht  (1.651 m)   Wt 91.2 kg   LMP 08/08/2021   SpO2 100%   BMI 33.45 kg/m  Physical Exam Vitals and nursing note reviewed.  Constitutional:      General: She is not in acute distress.    Appearance: She is well-developed.  HENT:     Head: Normocephalic and atraumatic.     Nose: Nose normal.  Eyes:     Conjunctiva/sclera: Conjunctivae normal.     Pupils:  Pupils are equal, round, and reactive to light.     Comments: Normal appearance  Cardiovascular:     Rate and Rhythm: Normal rate and regular rhythm.     Pulses: Normal pulses.     Heart sounds: Normal heart sounds.  Pulmonary:     Effort: Pulmonary effort is normal. No respiratory distress.     Breath sounds: Normal breath sounds.  Abdominal:     General: Bowel sounds are normal. There is no distension.     Palpations: Abdomen is soft. There is no mass.     Tenderness: There is no abdominal tenderness. There is no guarding or rebound.  Genitourinary:    Comments: No CVA tenderness Musculoskeletal:        General: Normal range of motion.     Cervical back: Normal range of motion.  Skin:    General: Skin is  warm and dry.     Capillary Refill: Capillary refill takes less than 2 seconds.     Findings: No rash.  Neurological:     General: No focal deficit present.     Mental Status: She is alert and oriented to person, place, and time.  Psychiatric:        Behavior: Behavior normal.     ED Results / Procedures / Treatments   Labs (all labs ordered are listed, but only abnormal results are displayed) Results for orders placed or performed during the hospital encounter of 12/05/21  SARS Coronavirus 2 by RT PCR (hospital order, performed in [facility] hospital lab) *cepheid single result test* Anterior Nasal Swab   Specimen: Anterior Nasal Swab  Result Value Ref Range   SARS Coronavirus 2 by RT PCR NEGATIVE NEGATIVE  CBC with Differential  Result Value Ref Range   WBC 8.4 4.0 - 10.5 K/uL   RBC 4.66 3.87 - 5.11 MIL/uL   Hemoglobin 12.4 12.0 - 15.0 g/dL   HCT 85.0 27.7 - 41.2 %   MCV 83.0 80.0 - 100.0 fL   MCH 26.6 26.0 - 34.0 pg   MCHC 32.0 30.0 - 36.0 g/dL   RDW 87.8 (H) 67.6 - 72.0 %   Platelets 278 150 - 400 K/uL   nRBC 0.0 0.0 - 0.2 %   Neutrophils Relative % 61 %   Neutro Abs 5.1 1.7 - 7.7 K/uL   Lymphocytes Relative 28 %   Lymphs Abs 2.4 0.7 - 4.0 K/uL   Monocytes Relative 6 %   Monocytes Absolute 0.5 0.1 - 1.0 K/uL   Eosinophils Relative 4 %   Eosinophils Absolute 0.4 0.0 - 0.5 K/uL   Basophils Relative 1 %   Basophils Absolute 0.1 0.0 - 0.1 K/uL   Immature Granulocytes 0 %   Abs Immature Granulocytes 0.03 0.00 - 0.07 K/uL  Basic metabolic panel  Result Value Ref Range   Sodium 141 135 - 145 mmol/L   Potassium 3.1 (L) 3.5 - 5.1 mmol/L   Chloride 108 98 - 111 mmol/L   CO2 24 22 - 32 mmol/L   Glucose, Bld 95 70 - 99 mg/dL   BUN 14 6 - 20 mg/dL   Creatinine, Ser 9.47 0.44 - 1.00 mg/dL   Calcium 8.9 8.9 - 09.6 mg/dL   GFR, Estimated >28 >36 mL/min   Anion gap 9 5 - 15  Troponin I (High Sensitivity)  Result Value Ref Range   Troponin I (High Sensitivity) <2 <18 ng/L   Troponin I (High Sensitivity)  Result Value Ref Range   Troponin I (  High Sensitivity) 2 <18 ng/L   DG Chest 2 View  Result Date: 12/05/2021 CLINICAL DATA:  COPD, shortness of breath EXAM: CHEST - 2 VIEW COMPARISON:  10/20/2012 FINDINGS: The heart size and mediastinal contours are within normal limits. Both lungs are clear. The visualized skeletal structures are unremarkable. IMPRESSION: No active cardiopulmonary disease. Electronically Signed   By: Charlett Nose M.D.   On: 12/05/2021 02:39     Radiology DG Chest 2 View  Result Date: 12/05/2021 CLINICAL DATA:  COPD, shortness of breath EXAM: CHEST - 2 VIEW COMPARISON:  10/20/2012 FINDINGS: The heart size and mediastinal contours are within normal limits. Both lungs are clear. The visualized skeletal structures are unremarkable. IMPRESSION: No active cardiopulmonary disease. Electronically Signed   By: Charlett Nose M.D.   On: 12/05/2021 02:39    Procedures Procedures    Medications Ordered in ED Medications  potassium chloride SA (KLOR-CON M) CR tablet 40 mEq (has no administration in time range)  ipratropium-albuterol (DUONEB) 0.5-2.5 (3) MG/3ML nebulizer solution 3 mL (3 mLs Nebulization Given 12/05/21 0103)  predniSONE (DELTASONE) tablet 60 mg (60 mg Oral Given 12/05/21 0257)    ED Course/ Medical Decision Making/ A&P                           Medical Decision Making SOB and wheezing for weeks.  Clear at this time   Problems Addressed: Chronic obstructive pulmonary disease, unspecified COPD type (HCC): chronic illness or injury    Details: will treat with steroids Hypokalemia: acute illness or injury    Details: treated in the ED  Amount and/or Complexity of Data Reviewed Independent Historian: spouse    Details: see above External Data Reviewed: notes.    Details: previous notes reviewed Labs: ordered.    Details: oall labs reviewed: troponins negative x 2, low potassium, normal creatinine, negative covid swab, normal  white count 8.4, normal hemoglobin 12.4 normal platelets. Radiology: ordered and independent interpretation performed.    Details: negative for PNA by me ECG/medicine tests: ordered and independent interpretation performed.    Details: see muse not crossing over  Risk Prescription drug management. Risk Details: Ruled out for MI in the ED.  I do not believe this is a PE.  Like COPD with anxiety.  No wheezing that this time.  Will start steroids.  Patient needs to stop smoking, and follow up with their doctor for ongoing care.      Final Clinical Impression(s) / ED Diagnoses Final diagnoses:  Hypokalemia  Chronic obstructive pulmonary disease, unspecified COPD type (HCC)   Return for intractable cough, coughing up blood, fevers > 100.4 unrelieved by medication, shortness of breath, intractable vomiting, chest pain, shortness of breath, weakness, numbness, changes in speech, facial asymmetry, abdominal pain, passing out, Inability to tolerate liquids or food, cough, altered mental status or any concerns. No signs of systemic illness or infection. The patient is nontoxic-appearing on exam and vital signs are within normal limits.  I have reviewed the triage vital signs and the nursing notes. Pertinent labs & imaging results that were available during my care of the patient were reviewed by me and considered in my medical decision making (see chart for details). After history, exam, and medical workup I feel the patient has been appropriately medically screened and is safe for discharge home. Pertinent diagnoses were discussed with the patient. Patient was given return precautions Rx / DC Orders ED Discharge Orders  Ordered    predniSONE (DELTASONE) 20 MG tablet        12/05/21 0427              Denya Buckingham, MD 12/05/21 [id number]

## 2021-12-05 NOTE — ED Notes (Signed)
Late Note: RT assessed pt in triage for SOB. Pt history of vaping and cigarette smoking. Pt states she was told she has asthma and COPD but has never seen pulmonologist for PFT. Pt given 5/5 for SOB. Pt states her breathing has improved at this time. RT discussed smoking cessation w/pt and explained to the pt the many different ways she has available to stop smoking/vaping. RT also suggested pt seek appt w/pulmonologist for PFT for evaluation of her airway issues. Pts Bilat BS clear diminished pre/post treatment. Pts respiratory status stable w/no distress noted at this time. RT will continue to monitor.

## 2021-12-05 NOTE — ED Triage Notes (Signed)
POV, pt sts that she has been having SOB x wks, she has been using rescue inhaler x 3 today without relief. Hx of COPD and asthma per pt. Pt alert and oriented x 4, ambulatory

## 2021-12-05 NOTE — ED Notes (Signed)
Pt verbalizes understanding of discharge instructions. Opportunity for questioning and answers were provided. Pt discharged from ED to home with friend.    

## 2021-12-06 ENCOUNTER — Other Ambulatory Visit: Payer: Self-pay

## 2021-12-06 ENCOUNTER — Emergency Department (HOSPITAL_COMMUNITY)
Admission: EM | Admit: 2021-12-06 | Discharge: 2021-12-06 | Disposition: A | Discharge status: Home / Self Care | Payer: 59 | Attending: Emergency Medicine | Admitting: Emergency Medicine

## 2021-12-06 ENCOUNTER — Encounter (HOSPITAL_COMMUNITY): Payer: Self-pay | Admitting: Emergency Medicine

## 2021-12-06 ENCOUNTER — Emergency Department (HOSPITAL_COMMUNITY): Payer: 59

## 2021-12-06 DIAGNOSIS — J45909 Unspecified asthma, uncomplicated: Secondary | ICD-10-CM | POA: Insufficient documentation

## 2021-12-06 DIAGNOSIS — G8929 Other chronic pain: Secondary | ICD-10-CM | POA: Diagnosis not present

## 2021-12-06 DIAGNOSIS — R059 Cough, unspecified: Secondary | ICD-10-CM | POA: Diagnosis not present

## 2021-12-06 DIAGNOSIS — F321 Major depressive disorder, single episode, moderate: Secondary | ICD-10-CM | POA: Insufficient documentation

## 2021-12-06 DIAGNOSIS — E785 Hyperlipidemia, unspecified: Secondary | ICD-10-CM | POA: Diagnosis not present

## 2021-12-06 DIAGNOSIS — J449 Chronic obstructive pulmonary disease, unspecified: Secondary | ICD-10-CM | POA: Diagnosis not present

## 2021-12-06 DIAGNOSIS — E876 Hypokalemia: Secondary | ICD-10-CM | POA: Insufficient documentation

## 2021-12-06 DIAGNOSIS — R0602 Shortness of breath: Secondary | ICD-10-CM | POA: Insufficient documentation

## 2021-12-06 DIAGNOSIS — Z7951 Long term (current) use of inhaled steroids: Secondary | ICD-10-CM | POA: Diagnosis not present

## 2021-12-06 LAB — BASIC METABOLIC PANEL
Anion gap: 7 (ref 5–15)
Anion gap: 5 (ref 5–15)
BUN: 13 mg/dL (ref 6–20)
BUN: 11 mg/dL (ref 6–20)
CO2: 22 mmol/L (ref 22–32)
CO2: 23 mmol/L (ref 22–32)
Calcium: 8.7 mg/dL — ABNORMAL LOW (ref 8.9–10.3)
Calcium: 8 mg/dL — ABNORMAL LOW (ref 8.9–10.3)
Chloride: 114 mmol/L — ABNORMAL HIGH (ref 98–111)
Chloride: 111 mmol/L (ref 98–111)
Creatinine, Ser: 0.58 mg/dL (ref 0.44–1.00)
Creatinine, Ser: 0.57 mg/dL (ref 0.44–1.00)
GFR, Estimated: >60 mL/min (ref >60)
GFR, Estimated: >60 mL/min (ref >60)
Glucose, Bld: 113 mg/dL — ABNORMAL HIGH (ref 70–99)
Glucose, Bld: 92 mg/dL (ref 70–99)
Potassium: 2.8 mmol/L — ABNORMAL LOW (ref 3.5–5.1)
Potassium: 3.5 mmol/L (ref 3.5–5.1)
Sodium: 143 mmol/L (ref 135–145)
Sodium: 139 mmol/L (ref 135–145)

## 2021-12-06 LAB — TSH: TSH: 4.627 u[IU]/mL — ABNORMAL HIGH (ref 0.350–4.500)

## 2021-12-06 LAB — URINALYSIS, ROUTINE W REFLEX MICROSCOPIC
Bilirubin Urine: NEGATIVE
Glucose, UA: NEGATIVE mg/dL
Hgb urine dipstick: NEGATIVE
Ketones, ur: NEGATIVE mg/dL
Leukocytes,Ua: NEGATIVE
Nitrite: NEGATIVE
Protein, ur: NEGATIVE mg/dL
Specific Gravity, Urine: <1.005 — ABNORMAL LOW (ref 1.005–1.030)
pH: 5 (ref 5.0–8.0)

## 2021-12-06 LAB — CBC
HCT: 39.5 % (ref 36.0–46.0)
Hemoglobin: 12.5 g/dL (ref 12.0–15.0)
MCH: 27.1 pg (ref 26.0–34.0)
MCHC: 31.6 g/dL (ref 30.0–36.0)
MCV: 85.5 fL (ref 80.0–100.0)
Platelets: 330 10*3/uL (ref 150–400)
RBC: 4.62 MIL/uL (ref 3.87–5.11)
RDW: 16.3 % — ABNORMAL HIGH (ref 11.5–15.5)
WBC: 14.2 10*3/uL — ABNORMAL HIGH (ref 4.0–10.5)
nRBC: 0 % (ref 0.0–0.2)

## 2021-12-06 LAB — T4, FREE: Free T4: 0.64 ng/dL (ref 0.61–1.12)

## 2021-12-06 LAB — I-STAT BETA HCG BLOOD, ED (MC, WL, AP ONLY): I-stat hCG, quantitative: <5 m[IU]/mL (ref <5)

## 2021-12-06 LAB — LITHIUM LEVEL: Lithium Lvl: 0.09 mmol/L — ABNORMAL LOW (ref 0.60–1.20)

## 2021-12-06 LAB — TROPONIN I (HIGH SENSITIVITY)
Troponin I (High Sensitivity): 6 ng/L (ref <18)
Troponin I (High Sensitivity): 5 ng/L (ref <18)

## 2021-12-06 LAB — NA AND K (SODIUM & POTASSIUM), RAND UR
Potassium Urine: 15 mmol/L
Sodium, Ur: 12 mmol/L

## 2021-12-06 LAB — BRAIN NATRIURETIC PEPTIDE: B Natriuretic Peptide: 36 pg/mL (ref 0.0–100.0)

## 2021-12-06 LAB — MAGNESIUM: Magnesium: 2.1 mg/dL (ref 1.7–2.4)

## 2021-12-06 MED ORDER — POTASSIUM CHLORIDE CRYS ER 20 MEQ PO TBCR
40.0000 meq | EXTENDED_RELEASE_TABLET | Freq: Once | ORAL | Status: AC
  Administered 2021-12-06: 40 meq via ORAL
  Filled 2021-12-06: qty 2

## 2021-12-06 MED ORDER — POTASSIUM CHLORIDE CRYS ER 20 MEQ PO TBCR: 40.0000 meq | EXTENDED_RELEASE_TABLET | Freq: Once | ORAL | Status: DC

## 2021-12-06 MED ORDER — IOHEXOL 350 MG/ML SOLN
75.0000 mL | Freq: Once | INTRAVENOUS | Status: AC | PRN
  Administered 2021-12-06: 75 mL via INTRAVENOUS

## 2021-12-06 MED ORDER — POTASSIUM CHLORIDE CRYS ER 20 MEQ PO TBCR
20.0000 meq | EXTENDED_RELEASE_TABLET | Freq: Once | ORAL | Status: AC
  Administered 2021-12-06: 20 meq via ORAL
  Filled 2021-12-06: qty 1

## 2021-12-06 MED ORDER — IPRATROPIUM-ALBUTEROL 0.5-2.5 (3) MG/3ML IN SOLN
3.0000 mL | Freq: Once | RESPIRATORY_TRACT | Status: AC
  Administered 2021-12-06: 3 mL via RESPIRATORY_TRACT
  Filled 2021-12-06: qty 3

## 2021-12-06 NOTE — Discharge Instructions (Addendum)
Follow-up with your primary care doctor for management of your chronic symptoms, as well as to recheck your electrolyte levels.  Return to the emergency department for any new or worsening symptoms of concern.

## 2021-12-06 NOTE — ED Provider Notes (Signed)
[facility] DEPT Provider Note   CSN: [id number] Arrival date & time: 12/06/21  [id number]     History {Add pertinent medical, surgical, social history, OB history to HPI:1} Chief Complaint  Patient presents with   Shortness of Breath    Sandra Jensen is a 51 y.o. female.   Shortness of Breath Associated symptoms: cough   Patient presents for shortness of breath.  Symptoms have been present over the past several weeks.  Dyspnea is worsened with exertion.  She was seen in the ED yesterday for similar symptoms.  She was treated for COPD and had potassium replacement.  Today she reports no symptomatic relief from her treatment yesterday morning.  She was prescribed prednisone and did take a dose yesterday.  She has had a slight cough.  She denies any other associated symptoms.  Medical history includes chronic pain, arthritis, bipolar disorder, substance abuse, depression, COPD, asthma, and HLD.     Home Medications Prior to Admission medications   Medication Sig Start Date End Date Taking? Authorizing Provider  albuterol (VENTOLIN HFA) 108 (90 Base) MCG/ACT inhaler INHALE 1-2 PUFFS BY MOUTH EVERY 6 HOURS AS NEEDED FOR WHEEZE OR SHORTNESS OF BREATH 12/02/21   Johnson, Megan P, DO  clonazePAM (KLONOPIN) 0.5 MG tablet Take 1 tablet (0.5 mg total) by mouth daily as needed for anxiety. 01/19/20 01/18/21  Arfeen, Arlyce Harman, MD  cyclobenzaprine (FLEXERIL) 10 MG tablet Take 1 tablet (10 mg total) by mouth daily as needed for muscle spasms. Patient not taking: Reported on 08/09/2020 01/05/20   Eulogio Bear, NP  FLUoxetine (PROZAC) 40 MG capsule Take 2 capsules by mouth daily.    [provider]  fluticasone (FLONASE) 50 MCG/ACT nasal spray Place 1 spray into both nostrils daily. 01/05/20   Eulogio Bear, NP  haloperidol (HALDOL) 5 MG tablet Take 5 mg by mouth 2 (two) times daily as needed. 07/21/21   [provider]  lithium 300 MG tablet Take 300  mg by mouth 3 (three) times daily as needed. 07/21/21   [provider]  Oxycodone HCl 10 MG TABS Take 10 mg by mouth every 6 (six) hours as needed. 07/20/21   [provider]  predniSONE (DELTASONE) 20 MG tablet 3 tabs po day one, then 2 po daily x 4 days 12/05/21   Randal Buba, April, MD  [facility] 80-4.5 MCG/ACT inhaler INHALE 2 PUFFS INTO THE LUNGS TWICE A DAY 11/09/21   Johnson, Megan P, DO  vitamin B-12 (CYANOCOBALAMIN) 1000 MCG tablet Take 1 tablet (1,000 mcg total) by mouth daily. 08/15/21   Park Liter P, DO      Allergies    Morphine and related, Acetaminophen, Cephalexin, and Vicodin [hydrocodone-acetaminophen]    Review of Systems   Review of Systems  Respiratory:  Positive for cough and shortness of breath.   All other systems reviewed and are negative.   Physical Exam Updated Vital Signs BP (!) 135/108   Pulse 83   Resp 19   Ht 5\' 5"  (1.651 m)   Wt 91.2 kg   LMP 08/08/2021   SpO2 98%   BMI 33.45 kg/m  Physical Exam Vitals and nursing note reviewed.  Constitutional:      General: She is not in acute distress.    Appearance: She is well-developed and normal weight. She is not ill-appearing, toxic-appearing or diaphoretic.  HENT:     Head: Normocephalic and atraumatic.     Mouth/Throat:     Mouth: Mucous membranes  are moist.     Pharynx: Oropharynx is clear.  Eyes:     Conjunctiva/sclera: Conjunctivae normal.  Neck:     Vascular: No JVD.  Cardiovascular:     Rate and Rhythm: Normal rate and regular rhythm.     Heart sounds: No murmur heard. Pulmonary:     Effort: Pulmonary effort is normal. No tachypnea or respiratory distress.     Breath sounds: Normal breath sounds. No decreased breath sounds, wheezing, rhonchi or rales.  Chest:     Chest wall: No tenderness.  Abdominal:     Palpations: Abdomen is soft.     Tenderness: There is no abdominal tenderness.  Musculoskeletal:        General: No swelling. Normal range of motion.     Cervical  back: Normal range of motion and neck supple.     Right lower leg: No edema.     Left lower leg: No edema.  Skin:    General: Skin is warm and dry.     Capillary Refill: Capillary refill takes less than 2 seconds.     Coloration: Skin is not cyanotic or pale.     Comments: Healing areas of rash on her legs that she attributes to poison ivy; rosacea on face  Neurological:     General: No focal deficit present.     Mental Status: She is alert and oriented to person, place, and time.     Cranial Nerves: No cranial nerve deficit.     Motor: No weakness.  Psychiatric:        Mood and Affect: Mood normal.        Behavior: Behavior normal.     ED Results / Procedures / Treatments   Labs (all labs ordered are listed, but only abnormal results are displayed) Labs Reviewed  BASIC METABOLIC PANEL - Abnormal; Notable for the following components:      Result Value   Potassium 2.8 (*)    Chloride 114 (*)    Glucose, Bld 113 (*)    Calcium 8.7 (*)    All other components within normal limits  CBC - Abnormal; Notable for the following components:   WBC 14.2 (*)    RDW 16.3 (*)    All other components within normal limits  TROPONIN I (HIGH SENSITIVITY)  TROPONIN I (HIGH SENSITIVITY)    EKG None  Radiology DG Chest 2 View  Result Date: 12/05/2021 CLINICAL DATA:  COPD, shortness of breath EXAM: CHEST - 2 VIEW COMPARISON:  10/20/2012 FINDINGS: The heart size and mediastinal contours are within normal limits. Both lungs are clear. The visualized skeletal structures are unremarkable. IMPRESSION: No active cardiopulmonary disease. Electronically Signed   By: Rolm Baptise M.D.   On: 12/05/2021 02:39    Procedures Procedures  {Document cardiac monitor, telemetry assessment procedure when appropriate:1}  Medications Ordered in ED Medications - No data to display  ED Course/ Medical Decision Making/ A&P                           Medical Decision Making Amount and/or Complexity of Data  Reviewed Labs: ordered. Radiology: ordered.  Risk Prescription drug management.   This patient presents to the ED for concern of shortness of breath, this involves an extensive number of treatment options, and is a complaint that carries with it a high risk of complications and morbidity.  The differential diagnosis includes CHF, COPD, pericarditis, PE, pneumonia   Co morbidities that  complicate the patient evaluation  chronic pain, arthritis, bipolar disorder, substance abuse, depression, COPD, asthma, and HLD   Additional history obtained:  Additional history obtained from N/A External records from outside source obtained and reviewed including EMR   Lab Tests:  I Ordered, and personally interpreted labs.  The pertinent results include:  ***   Imaging Studies ordered:  I ordered imaging studies including ***  I independently visualized and interpreted imaging which showed *** I agree with the radiologist interpretation   Cardiac Monitoring: / EKG:  The patient was maintained on a cardiac monitor.  I personally viewed and interpreted the cardiac monitored which showed an underlying rhythm of: ***   Consultations Obtained:  I requested consultation with the ***,  and discussed lab and imaging findings as well as pertinent plan - they recommend: ***   Problem List / ED Course / Critical interventions / Medication management  *** I ordered medication including ***  for ***  Reevaluation of the patient after these medicines showed that the patient {resolved/improved/worsened:23923::"improved"} I have reviewed the patients home medicines and have made adjustments as needed   Social Determinants of Health:  ***   Test / Admission - Considered:  ***   {Document critical care time when appropriate:1} {Document review of labs and clinical decision tools ie heart score, Chads2Vasc2 etc:1}  {Document your independent review of radiology images, and any outside  records:1} {Document your discussion with family members, caretakers, and with consultants:1} {Document social determinants of health affecting pt's care:1} {Document your decision making why or why not admission, treatments were needed:1} Final Clinical Impression(s) / ED Diagnoses Final diagnoses:  None    Rx / DC Orders ED Discharge Orders     None

## 2021-12-06 NOTE — ED Triage Notes (Addendum)
Pt reports SOB x 1 week, exertion with walking, was seen yesterday for same, hx of COPD

## 2021-12-07 LAB — T3, FREE: T3, Free: 3.1 pg/mL (ref 2.0–4.4)
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
## 2021-12-12 ENCOUNTER — Ambulatory Visit (INDEPENDENT_AMBULATORY_CARE_PROVIDER_SITE_OTHER): Payer: 59 | Admitting: Student

## 2021-12-12 ENCOUNTER — Encounter: Payer: Self-pay | Admitting: Student

## 2021-12-12 VITALS — BP 116/78 | HR 78 | Temp 98.0°F | Ht 65.0 in | Wt 209.0 lb

## 2021-12-12 DIAGNOSIS — J449 Chronic obstructive pulmonary disease, unspecified: Secondary | ICD-10-CM | POA: Diagnosis not present

## 2021-12-12 DIAGNOSIS — R0609 Other forms of dyspnea: Secondary | ICD-10-CM

## 2021-12-12 MED ORDER — AEROCHAMBER MV MISC: 0 refills | Status: AC

## 2021-12-12 MED ORDER — BREZTRI AEROSPHERE 160-9-4.8 MCG/ACT IN AERO: 2.0000 | INHALATION_SPRAY | Freq: Two times a day (BID) | RESPIRATORY_TRACT | 0 refills | Status: DC

## 2021-12-12 MED ORDER — BREZTRI AEROSPHERE 160-9-4.8 MCG/ACT IN AERO: 2.0000 | INHALATION_SPRAY | Freq: Two times a day (BID) | RESPIRATORY_TRACT | 11 refills | Status: AC

## 2021-12-12 NOTE — Patient Instructions (Signed)
-   stop symbicort - start breztri 2 puffs twice daily with spacer rinse mouth and spacer after each use - albuterol as needed - if chest pressure sensation persists despite change to breztri inhaler you may want to consider seeking appointment with cardiology for coronary CTA imaging or stress test even before our next appointment - breathing tests (PFTs) next appointment in 6 weeks

## 2022-01-04 IMAGING — MG DIGITAL SCREENING BILAT W/ CAD
5 series · 5 of 5 positions shown · non-contrast
Comparison: None.

CLINICAL DATA: Screening.

EXAM:
DIGITAL SCREENING BILATERAL MAMMOGRAM WITH CAD

[L MLO (1 of 2)]
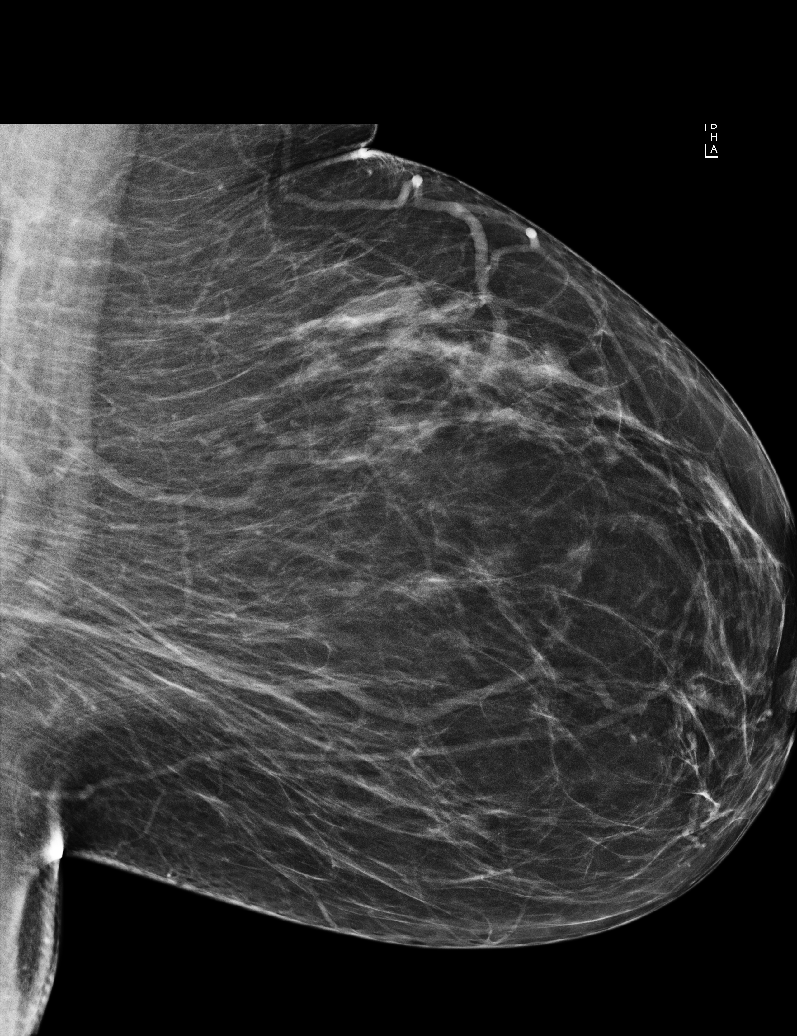

[L MLO (2 of 2)]
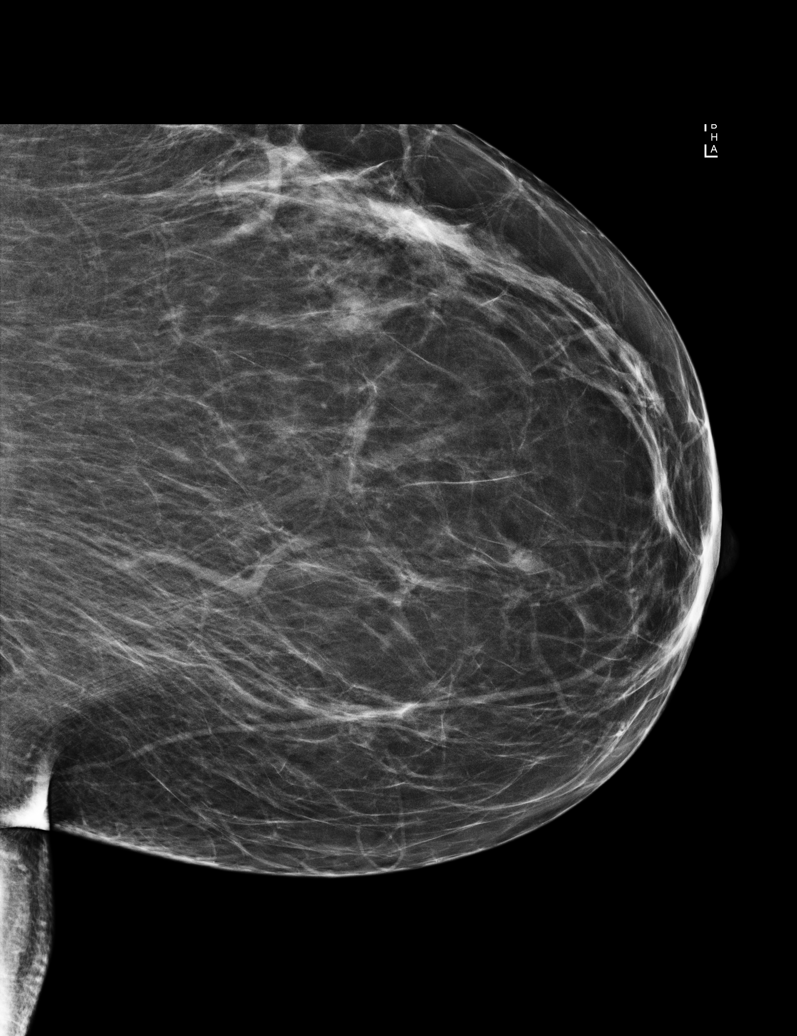

[L CC]
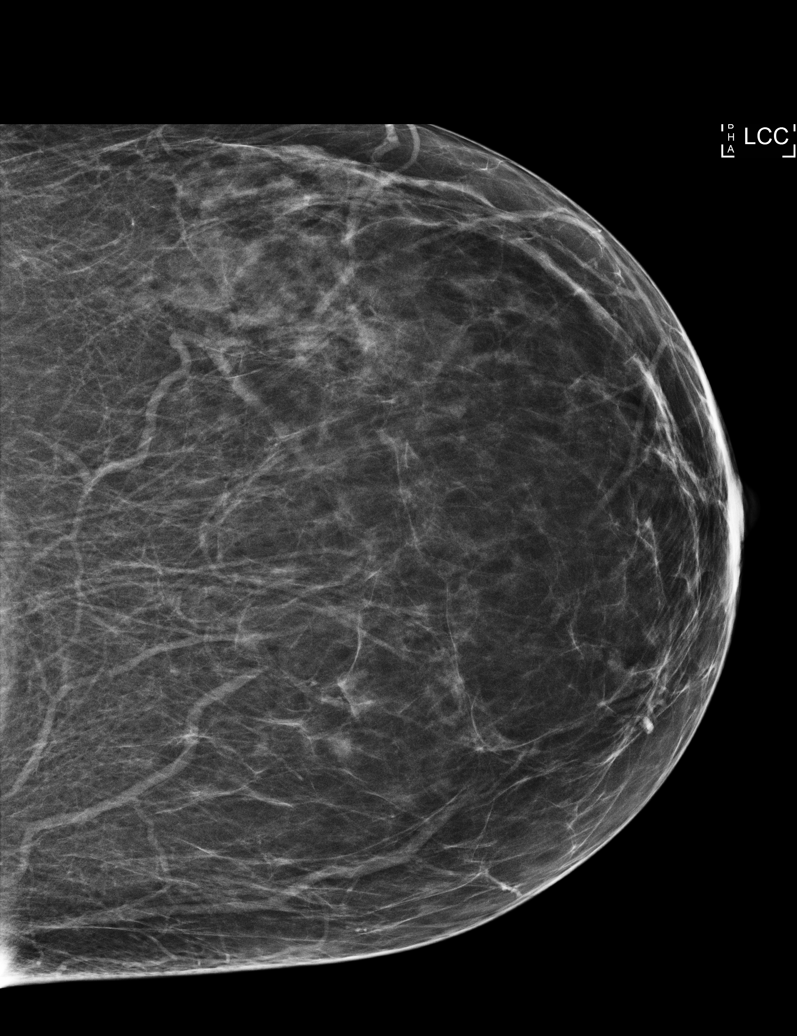

[R CC]
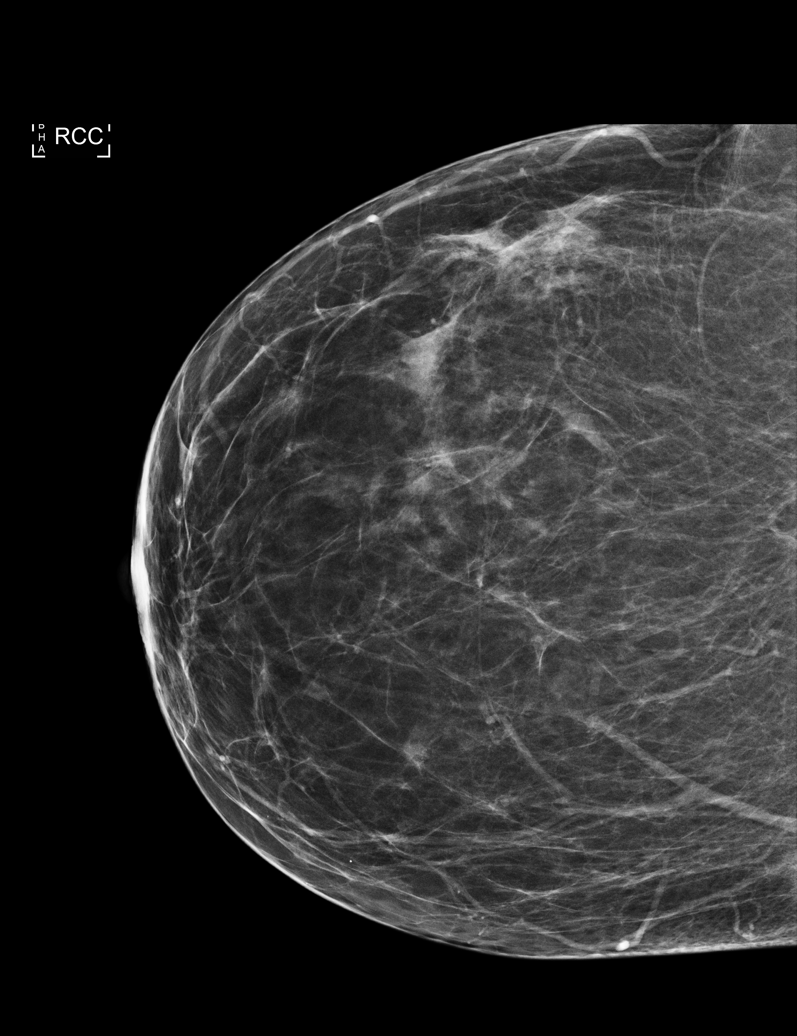

[R MLO]
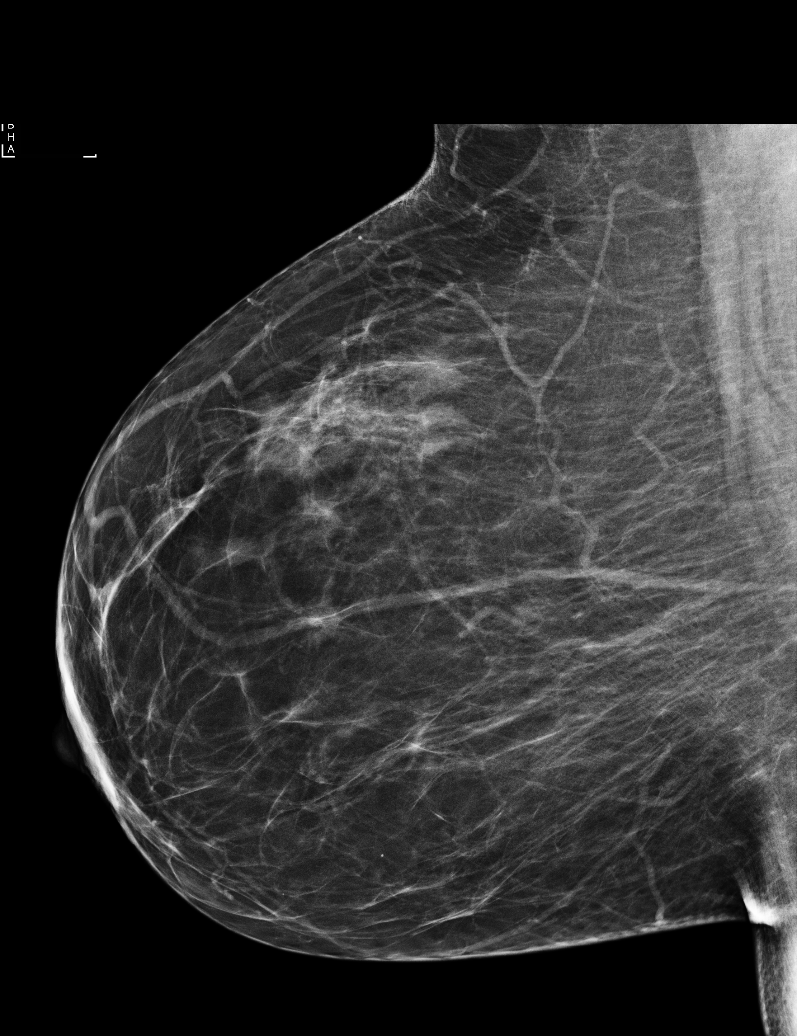

[5 of 5 positions shown; findings below may reference images not displayed]

ACR Breast Density Category b: There are scattered areas of
fibroglandular density.
FINDINGS: There are no findings suspicious for malignancy. Images were
processed with CAD.
IMPRESSION: No mammographic evidence of malignancy. A result letter of this
screening mammogram will be mailed directly to the patient.

RECOMMENDATION:
Screening mammogram in one year. (Code:[id number])

BI-RADS CATEGORY  1: Negative.
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
## 2022-02-01 ENCOUNTER — Ambulatory Visit (INDEPENDENT_AMBULATORY_CARE_PROVIDER_SITE_OTHER): Payer: 59 | Admitting: Student

## 2022-02-01 ENCOUNTER — Encounter: Payer: Self-pay | Admitting: Student

## 2022-02-01 VITALS — BP 128/90 | HR 90 | Temp 98.2°F | Ht 65.0 in | Wt 215.0 lb

## 2022-02-01 DIAGNOSIS — R0789 Other chest pain: Secondary | ICD-10-CM | POA: Diagnosis not present

## 2022-02-01 DIAGNOSIS — J449 Chronic obstructive pulmonary disease, unspecified: Secondary | ICD-10-CM

## 2022-02-01 LAB — PULMONARY FUNCTION TEST
DL/VA % pred: 137 %
DL/VA: 5.9 ml/min/mmHg/L
DLCO cor % pred: 118 %
DLCO cor: 25.77 ml/min/mmHg
DLCO unc % pred: 118 %
DLCO unc: 25.77 ml/min/mmHg
FEF 25-75 Post: 3.56 L/sec
FEF 25-75 Pre: 2.81 L/sec
FEF2575-%Change-Post: 26 %
FEF2575-%Pred-Post: 126 %
FEF2575-%Pred-Pre: 99 %
FEV1-%Change-Post: 7 %
FEV1-%Pred-Post: 92 %
FEV1-%Pred-Pre: 86 %
FEV1-Post: 2.68 L
FEV1-Pre: 2.5 L
FEV1FVC-%Change-Post: 7 %
FEV1FVC-%Pred-Pre: 105 %
FEV6-%Change-Post: 1 %
FEV6-%Pred-Post: 82 %
FEV6-%Pred-Pre: 81 %
FEV6-Post: 2.95 L
FEV6-Pre: 2.91 L
FEV6FVC-%Pred-Post: 102 %
FEV6FVC-%Pred-Pre: 102 %
FVC-%Change-Post: 0 %
FVC-%Pred-Post: 80 %
FVC-%Pred-Pre: 80 %
FVC-Post: 2.95 L
FVC-Pre: 2.96 L
Post FEV1/FVC ratio: 91 %
Post FEV6/FVC ratio: 100 %
Pre FEV1/FVC ratio: 84 %
Pre FEV6/FVC Ratio: 100 %
RV % pred: 99 %
RV: 1.83 L
TLC % pred: 94 %
TLC: 4.9 L

## 2022-02-01 NOTE — Patient Instructions (Signed)
Full PFT performed today. °

## 2022-02-01 NOTE — Patient Instructions (Signed)
-   referral placed to cardiology for exertional chest pressure - continue breztri 2 puffs twice daily rinse mouth and spacer after each use - albuterol as needed - see you in 6 months or sooner if need be!

## 2022-02-01 NOTE — Progress Notes (Signed)
Full PFT performed today. °

## 2022-03-03 ENCOUNTER — Encounter: Payer: Self-pay | Admitting: Cardiology

## 2022-03-03 ENCOUNTER — Ambulatory Visit: Payer: Medicare Other | Attending: Cardiology | Admitting: Cardiology

## 2022-03-03 VITALS — BP 150/102 | HR 86 | Ht 65.0 in | Wt 212.6 lb

## 2022-03-03 DIAGNOSIS — Q211 Atrial septal defect, unspecified: Secondary | ICD-10-CM

## 2022-03-03 DIAGNOSIS — R072 Precordial pain: Secondary | ICD-10-CM | POA: Diagnosis not present

## 2022-03-03 DIAGNOSIS — R030 Elevated blood-pressure reading, without diagnosis of hypertension: Secondary | ICD-10-CM

## 2022-03-03 DIAGNOSIS — R079 Chest pain, unspecified: Secondary | ICD-10-CM | POA: Diagnosis not present

## 2022-03-03 MED ORDER — METOPROLOL TARTRATE 100 MG PO TABS: 100.0000 mg | ORAL_TABLET | Freq: Once | ORAL | 0 refills | Status: AC

## 2022-03-03 NOTE — Addendum Note (Signed)
Addended by: Theresia Majors on: 03/03/2022 02:11 PM   Modules accepted: Orders

## 2022-03-03 NOTE — Progress Notes (Signed)
Cardiology CONSULT Note    Date:  03/03/2022   ID:  Sandra Jensen, DOB [birth year], MRN [id number]  PCP:  Valerie Roys, DO  Cardiologist:  Fransico Him, MD   Chief Complaint  Patient presents with   New Patient (Initial Visit)    Chest pain     History of Present Illness:  Sandra Jensen is a 51 y.o. female who is being seen today for the evaluation of chest pain at the request of Maryjane Hurter, MD.  This is a 51 year old female with a history of ADHD, anxiety, depression, asthma, bipolar disorder, COPD and PTSD as well as schizophrenia who presents for evaluation of chest pain.She started having chest pain a few months ago.  She describes it as "5 elephants sitting on my chest".  It is midsternal with no radiation but does have diaphoresis with it.  She gets SOB with the pain and when she exerts herself.  The pain occurs every few weeks and lasts anywhere from a few seconds to 10-15 min.  It is nonexertional and nothing makes it better. 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  She has a hx of tobacco use and now vapes.  She has a fm hx of CAD.  Her biological father had CAD requiring CABG in his 42's, paternal aunt had carotid stenosis in 31's and her Dad's mom had CAD as well.    Past Medical History:  Diagnosis Date   ADHD    Allergy    Anxiety    Arthritis    Asthma    Bipolar 1 disorder, depressed ([place])    COPD (chronic obstructive pulmonary disease) (HCC)    Depression    Heart murmur    Kidney infection    Leg cramps    Medial meniscus tear    Bilateral   Osteoporosis    Personality disorder (HCC)    PONV (postoperative nausea and vomiting)    PTSD (post-traumatic stress disorder)    PTSD (post-traumatic stress disorder)    Schizophrenia ([place])    Torn ACL    Right and left    Past Surgical History:  Procedure Laterality Date   CESAREAN SECTION     RADIOLOGY WITH ANESTHESIA Left 04/19/2017   Procedure:  MRI OF LEFT KNEE WITHOUT CONTRAST;  Surgeon: Radiologist, Medication, MD;  Location: [facility];  Service: Radiology;  Laterality: Left;   TUBAL LIGATION     Burned    Current Medications: Current Meds  Medication Sig   ACNE MEDICATION 2.5 2.5 % gel Apply topically 2 (two) times daily.   albuterol (VENTOLIN HFA) 108 (90 Base) MCG/ACT inhaler INHALE 1-2 PUFFS BY MOUTH EVERY 6 HOURS AS NEEDED FOR WHEEZE OR SHORTNESS OF BREATH   Budeson-Glycopyrrol-Formoterol (BREZTRI AEROSPHERE) 160-9-4.8 MCG/ACT AERO Inhale 2 puffs into the lungs in the morning and at bedtime.   FLUoxetine (PROZAC) 40 MG capsule Take 2 capsules by mouth daily.   fluticasone (FLONASE) 50 MCG/ACT nasal spray Place 1 spray into both nostrils daily.   gabapentin (NEURONTIN) 300 MG capsule Take 300 mg by mouth 2 (two) times daily.   haloperidol (HALDOL) 5 MG tablet Take 5 mg by mouth 2 (two) times daily as needed.   lithium 300 MG tablet Take 300 mg by mouth 3 (three) times daily as needed.   methocarbamol (ROBAXIN) 500 MG tablet Take 500 mg by mouth 3 (three) times daily.   metoprolol  tartrate (LOPRESSOR) 100 MG tablet Take 1 tablet (100 mg total) by mouth once for 1 dose. Take 1 tablet (100 mg total) two hours prior to CT scan.   metroNIDAZOLE (METROGEL) 1 % gel Apply topically daily.   Oxycodone HCl 10 MG TABS Take 10 mg by mouth every 6 (six) hours as needed.   rosuvastatin (CRESTOR) 20 MG tablet Take 20 mg by mouth at bedtime.   Spacer/Aero-Holding Chambers (AEROCHAMBER MV) inhaler Use as instructed   vitamin B-12 (CYANOCOBALAMIN) 1000 MCG tablet Take 1 tablet (1,000 mcg total) by mouth daily.   Vitamin D, Ergocalciferol, (DRISDOL) 1.25 MG (50000 UNIT) CAPS capsule Take 50,000 Units by mouth once a week.    Allergies:   Morphine and related, Acetaminophen, Cephalexin, and Vicodin [hydrocodone-acetaminophen]   Social History   Socioeconomic History   Marital status: Single    Spouse name: Not on file   Number of children:  Not on file   Years of education: Not on file   Highest education level: Not on file  Occupational History   Occupation: disability  Tobacco Use   Smoking status: Former    Packs/day: 0.25    Years: 30.00    Total pack years: 7.50    Types: Cigarettes    Quit date: 09/24/2021    Years since quitting: 0.4   Smokeless tobacco: Never  Vaping Use   Vaping Use: Never used  Substance and Sexual Activity   Alcohol use: No   Drug use: No   Sexual activity: Not Currently    Birth control/protection: Condom  Other Topics Concern   Not on file  Social History Narrative   Not on file   Social Determinants of Health   Financial Resource Strain: Medium Risk (08/09/2020)   Overall Financial Resource Strain (CARDIA)    Difficulty of Paying Living Expenses: Somewhat hard  Food Insecurity: Food Insecurity Present (08/09/2020)   Hunger Vital Sign    Worried About Running Out of Food in the Last Year: Sometimes true    Ran Out of Food in the Last Year: Sometimes true  Transportation Needs: No Transportation Needs (08/09/2020)   PRAPARE - Hydrologist (Medical): No    Lack of Transportation (Non-Medical): No  Physical Activity: Inactive (08/09/2020)   Exercise Vital Sign    Days of Exercise per Week: 0 days    Minutes of Exercise per Session: 0 min  Stress: No Stress Concern Present (08/09/2020)   [place]    Feeling of Stress : Not at all  Social Connections: Not on file     Family History:  The patient's family history includes Alcohol abuse in her father and mother; Autism in her son; Breast cancer in her paternal aunt; COPD in her mother; Cancer in her maternal aunt; Diabetes in her paternal aunt; Drug abuse in her brother; Heart disease in her father and paternal grandmother; Hypertension in her paternal grandmother; Mental illness in her brother, daughter, and sister. She was adopted.   ROS:    Please see the history of present illness.    ROS All other systems reviewed and are negative.      No data to display             PHYSICAL EXAM:   VS:  BP (!) 150/102   Pulse 86   Ht 5\' 5"  (1.651 m)   Wt 212 lb 9.6 oz (96.4 kg)   SpO2 98%  BMI 35.38 kg/m    GEN: Well nourished, well developed, in no acute distress  HEENT: normal  Neck: no JVD, carotid bruits, or masses Cardiac: RRR; no murmurs, rubs, or gallops,no edema.  Intact distal pulses bilaterally.  Respiratory:  clear to auscultation bilaterally, normal work of breathing GI: soft, nontender, nondistended, + BS MS: no deformity or atrophy  Skin: warm and dry, no rash Neuro:  Alert and Oriented x 3, Strength and sensation are intact Psych: euthymic mood, full affect  Wt Readings from Last 3 Encounters:  03/03/22 212 lb 9.6 oz (96.4 kg)  02/01/22 215 lb (97.5 kg)  12/12/21 209 lb (94.8 kg)      Studies/Labs Reviewed:   EKG:  EKG is ordered today.  The ekg ordered today demonstrates NSR with no ST changes  Recent Labs: 08/12/2021: ALT 22 12/06/2021: B Natriuretic Peptide 36.0; BUN 11; Creatinine, Ser 0.57; Hemoglobin 12.5; Magnesium 2.1; Platelets 330; Potassium 3.5; Sodium 139; TSH 4.627   Lipid Panel    Component Value Date/Time   CHOL 237 (H) 08/12/2021 1621   TRIG 135 08/12/2021 1621   HDL 51 08/12/2021 1621   LDLCALC 162 (H) 08/12/2021 1621     CHA2DS2-VASc Score =     This indicates a  % annual risk of stroke. The patient's score is based upon:       HYPERTENSION CONTROL Vitals:   03/03/22 1317 03/03/22 1402  BP: (!) 124/100 (!) 150/102    The patient's blood pressure is elevated above target today.  In order to address the patient's elevated BP: Blood pressure will be monitored at home to determine if medication changes need to be made.        Additional studies/ records that were reviewed today include:  Office visit notes from PCP    ASSESSMENT:    1. Chest pain of  uncertain etiology   2. Elevated blood pressure reading without diagnosis of hypertension   3. ASD (atrial septal defect)   4. Precordial pain      PLAN:  In order of problems listed above:  Chest pain -EKG is nonischemic -her CRFs include fm hx of premature CAD, tobacco use -I will get a coronary CTA to define coronary anatomy  2.  Elevated BP with no dx of HTN -BP is elevated today -I will get a 48-hour blood pressure monitor to assess blood pressure control  3.  Hx of "2 holes in my heart" -check 2D echo to evaluate for ASD or VSD with bubble study  Time Spent: 20 minutes total time of encounter, including 15 minutes spent in face-to-face patient care on the date of this encounter. This time includes coordination of care and counseling regarding above mentioned problem list. Remainder of non-face-to-face time involved reviewing chart documents/testing relevant to the patient encounter and documentation in the medical record. I have independently reviewed documentation from referring provider  Medication Adjustments/Labs and Tests Ordered: Current medicines are reviewed at length with the patient today.  Concerns regarding medicines are outlined above.  Medication changes, Labs and Tests ordered today are listed in the Patient Instructions below.  Patient Instructions  Medication Instructions:  Your physician recommends that you continue on your current medications as directed. Please refer to the Current Medication list given to you today.  *If you need a refill on your cardiac medications before your next appointment, please call your pharmacy*  Lab Work: TODAY: BMET If you have labs (blood work) drawn today and your tests are completely normal,  you will receive your results only by: MyChart Message (if you have MyChart) OR A paper copy in the mail If you have any lab test that is abnormal or we need to change your treatment, we will call you to review the  results.  Testing/Procedures: Your physician has requested that you have an echocardiogram. Echocardiography is a painless test that uses sound waves to create images of your heart. It provides your doctor with information about the size and shape of your heart and how well your heart's chambers and valves are working. This procedure takes approximately one hour. There are no restrictions for this procedure.  Your physician has requested that you have a coronary CTA scan. Please see below for further instructions.   Follow-Up: At [facility], you and your health needs are our priority.  As part of our continuing mission to provide you with exceptional heart care, we have created designated Provider Care Teams.  These Care Teams include your primary Cardiologist (physician) and Advanced Practice Providers (APPs -  Physician Assistants and Nurse Practitioners) who all work together to provide you with the care you need, when you need it.  Follow up with Dr. Mayford Knife as needed based on results of testing.   Other Instructions   Your cardiac CT will be scheduled at:   [facility] [address] [place], [place] [address] [phone]  please arrive at the [facility] and Children's Entrance (Entrance C2) of [facility] 30 minutes prior to test start time. You can use the FREE valet parking offered at entrance C (encouraged to control the heart rate for the test)  Proceed to the [facility] Radiology Department (first floor) to check-in and test prep.  All radiology patients and guests should use entrance C2 at [facility], accessed from [facility], even though the hospital's physical address listed is [address]      Please follow these instructions carefully (unless otherwise directed):  On the Night Before the Test: Be sure to Drink plenty of water. Do not consume any caffeinated/decaffeinated beverages or chocolate 12 hours  prior to your test. Do not take any antihistamines 12 hours prior to your test.  On the Day of the Test: Drink plenty of water until 1 hour prior to the test. You may take your regular medications prior to the test.  Take metoprolol (Lopressor) two hours prior to test. FEMALES- please wear underwire-free bra if available, avoid dresses & tight clothing  After the Test: Drink plenty of water. After receiving IV contrast, you may experience a mild flushed feeling. This is normal. On occasion, you may experience a mild rash up to 24 hours after the test. This is not dangerous. If this occurs, you can take Benadryl 25 mg and increase your fluid intake. If you experience trouble breathing, this can be serious. If it is severe call 911 IMMEDIATELY. If it is mild, please call our office. If you take any of these medications: Glipizide/Metformin, Avandament, Glucavance, please do not take 48 hours after completing test unless otherwise instructed.  We will call to schedule your test 2-4 weeks out understanding that some insurance companies will need an authorization prior to the service being performed.   For non-scheduling related questions, please contact the cardiac imaging nurse navigator should you have any questions/concerns: Rockwell Alexandria, Cardiac Imaging Nurse Navigator Larey Brick, Cardiac Imaging Nurse Navigator [facility] Direct Office Dial: [phone]   For scheduling  needs, including cancellations and rescheduling, please call [place], [phone].   Important Information About Sugar         Signed, Armanda Magic, MD  03/03/2022 2:06 PM    [facility] [address] [place], [place], [place]  [address] Phone: [phone]; Fax: [phone]

## 2022-03-03 NOTE — Patient Instructions (Addendum)
Medication Instructions:  Your physician recommends that you continue on your current medications as directed. Please refer to the Current Medication list given to you today.  *If you need a refill on your cardiac medications before your next appointment, please call your pharmacy*  Lab Work: TODAY: BMET If you have labs (blood work) drawn today and your tests are completely normal, you will receive your results only by: MyChart Message (if you have MyChart) OR A paper copy in the mail If you have any lab test that is abnormal or we need to change your treatment, we will call you to review the results.  Testing/Procedures: Your physician has requested that you have an echocardiogram. Echocardiography is a painless test that uses sound waves to create images of your heart. It provides your doctor with information about the size and shape of your heart and how well your heart's chambers and valves are working. This procedure takes approximately one hour. There are no restrictions for this procedure.  Your physician has requested that you have a coronary CTA scan. Please see below for further instructions.   Follow-Up: At [facility], you and your health needs are our priority.  As part of our continuing mission to provide you with exceptional heart care, we have created designated Provider Care Teams.  These Care Teams include your primary Cardiologist (physician) and Advanced Practice Providers (APPs -  Physician Assistants and Nurse Practitioners) who all work together to provide you with the care you need, when you need it.  Follow up with Dr. Mayford Knife as needed based on results of testing.   Other Instructions   Your cardiac CT will be scheduled at:   [facility] [address] [place], [place] [address] [phone]  please arrive at the [facility] and Children's Entrance (Entrance C2) of [facility] 30 minutes prior to test start time. You can use the  FREE valet parking offered at entrance C (encouraged to control the heart rate for the test)  Proceed to the [facility] Radiology Department (first floor) to check-in and test prep.  All radiology patients and guests should use entrance C2 at [facility], accessed from [facility], even though the hospital's physical address listed is [address]      Please follow these instructions carefully (unless otherwise directed):  On the Night Before the Test: Be sure to Drink plenty of water. Do not consume any caffeinated/decaffeinated beverages or chocolate 12 hours prior to your test. Do not take any antihistamines 12 hours prior to your test.  On the Day of the Test: Drink plenty of water until 1 hour prior to the test. You may take your regular medications prior to the test.  Take metoprolol (Lopressor) two hours prior to test. FEMALES- please wear underwire-free bra if available, avoid dresses & tight clothing  After the Test: Drink plenty of water. After receiving IV contrast, you may experience a mild flushed feeling. This is normal. On occasion, you may experience a mild rash up to 24 hours after the test. This is not dangerous. If this occurs, you can take Benadryl 25 mg and increase your fluid intake. If you experience trouble breathing, this can be serious. If it is severe call 911 IMMEDIATELY. If it is mild, please call our office. If you take any of these medications: Glipizide/Metformin, Avandament, Glucavance, please do not take 48 hours after completing test unless otherwise instructed.  We will call to schedule your test  2-4 weeks out understanding that some insurance companies will need an authorization prior to the service being performed.   For non-scheduling related questions, please contact the cardiac imaging nurse navigator should you have any questions/concerns: Rockwell Alexandria, Cardiac Imaging Nurse Navigator Larey Brick, Cardiac  Imaging Nurse Navigator [facility] Direct Office Dial: [phone]   For scheduling needs, including cancellations and rescheduling, please call [place], [phone].   Important Information About Sugar

## 2022-03-04 LAB — BASIC METABOLIC PANEL
BUN/Creatinine Ratio: 18 (ref 9–23)
BUN: 10 mg/dL (ref 6–24)
CO2: 22 mmol/L (ref 20–29)
Calcium: 9.3 mg/dL (ref 8.7–10.2)
Chloride: 104 mmol/L (ref 96–106)
Creatinine, Ser: 0.56 mg/dL — ABNORMAL LOW (ref 0.57–1.00)
Glucose: 81 mg/dL (ref 70–99)
Potassium: 4.1 mmol/L (ref 3.5–5.2)
Sodium: 142 mmol/L (ref 134–144)
eGFR: 111 mL/min/{1.73_m2} (ref >59)

## 2022-03-16 ENCOUNTER — Ambulatory Visit (HOSPITAL_COMMUNITY): Payer: Medicare Other | Attending: Cardiology

## 2022-03-16 DIAGNOSIS — R072 Precordial pain: Secondary | ICD-10-CM | POA: Diagnosis present

## 2022-03-16 DIAGNOSIS — R079 Chest pain, unspecified: Secondary | ICD-10-CM

## 2022-03-16 DIAGNOSIS — R030 Elevated blood-pressure reading, without diagnosis of hypertension: Secondary | ICD-10-CM | POA: Diagnosis present

## 2022-03-16 DIAGNOSIS — Q211 Atrial septal defect, unspecified: Secondary | ICD-10-CM | POA: Diagnosis present

## 2022-03-16 LAB — ECHOCARDIOGRAM COMPLETE
Area-P 1/2: 3.16 cm2
Calc EF: 61.6 %
Single Plane A2C EF: 61.1 %
Single Plane A4C EF: 64.1 %

## 2022-03-21 ENCOUNTER — Telehealth (HOSPITAL_COMMUNITY): Payer: Self-pay | Admitting: Emergency Medicine

## 2022-03-21 NOTE — Telephone Encounter (Signed)
Attempted to call patient regarding upcoming cardiac CT appointment. °Left message on voicemail with name and callback number °Lashanta Elbe RN Navigator Cardiac Imaging °[facility] °[phone] Office °[phone] Cell ° °

## 2022-03-22 ENCOUNTER — Ambulatory Visit (HOSPITAL_COMMUNITY): Payer: Medicare Other

## 2022-03-24 ENCOUNTER — Ambulatory Visit (HOSPITAL_COMMUNITY)
Admission: RE | Admit: 2022-03-24 | Discharge: 2022-03-24 | Disposition: A | Discharge status: Home / Self Care | Payer: Medicare Other | Source: Ambulatory Visit | Attending: Cardiology | Admitting: Cardiology

## 2022-03-24 ENCOUNTER — Encounter (HOSPITAL_COMMUNITY): Payer: Self-pay

## 2022-03-24 DIAGNOSIS — R072 Precordial pain: Secondary | ICD-10-CM | POA: Insufficient documentation

## 2022-03-24 MED ORDER — NITROGLYCERIN 0.4 MG SL SUBL
SUBLINGUAL_TABLET | SUBLINGUAL | Status: AC
  Filled 2022-03-24: qty 2

## 2022-03-24 MED ORDER — NITROGLYCERIN 0.4 MG SL SUBL
0.8000 mg | SUBLINGUAL_TABLET | Freq: Once | SUBLINGUAL | Status: AC
  Administered 2022-03-24: 0.8 mg via SUBLINGUAL

## 2022-03-24 MED ORDER — IOHEXOL 350 MG/ML SOLN
95.0000 mL | Freq: Once | INTRAVENOUS | Status: AC | PRN
  Administered 2022-03-24: 95 mL via INTRAVENOUS

## 2022-04-04 ENCOUNTER — Telehealth: Payer: Self-pay | Admitting: Cardiology

## 2022-04-04 DIAGNOSIS — K219 Gastro-esophageal reflux disease without esophagitis: Secondary | ICD-10-CM

## 2022-04-04 MED ORDER — PANTOPRAZOLE SODIUM 40 MG PO TBEC: 40.0000 mg | DELAYED_RELEASE_TABLET | Freq: Every day | ORAL | 11 refills | Status: DC

## 2022-04-04 NOTE — Telephone Encounter (Signed)
-----   Message from Sueanne Margarita, MD sent at 03/25/2022  8:31 AM EDT ----- Coronary Ca score 0 and no CAD.  There is fluid layering in the distal esophagus likely related to GERD - please forward this to her PCP to followup on treatment for GERD.  Start Protonix 40mg  daily and get into see Dr. Therisa Doyne with [facility]

## 2022-04-04 NOTE — Telephone Encounter (Signed)
Pt is returning call. Requesting call back in regardst to results.

## 2022-04-04 NOTE — Telephone Encounter (Signed)
The patient has been notified of the result and verbalized understanding.  All questions (if any) were answered. Antonieta Iba, RN 04/04/2022 2:43 PM  Protonix has been sent in. Referral has been placed.

## 2022-04-05 ENCOUNTER — Encounter: Payer: Self-pay | Admitting: Gastroenterology

## 2022-04-05 ENCOUNTER — Telehealth: Payer: Self-pay | Admitting: Cardiology

## 2022-04-05 MED ORDER — ESOMEPRAZOLE MAGNESIUM 40 MG PO CPDR: 40.0000 mg | DELAYED_RELEASE_CAPSULE | Freq: Every day | ORAL | 3 refills | Status: DC

## 2022-04-05 NOTE — Telephone Encounter (Signed)
Spoke with the patient and advised on medication changes per Dr. Radford Pax. She does not have an appointment with GI yet. Will follow up on this.

## 2022-04-05 NOTE — Telephone Encounter (Signed)
Pt c/o medication issue:  1. Name of Medication:   pantoprazole (PROTONIX) 40 MG tablet    2. How are you currently taking this medication (dosage and times per day)? Take 1 tablet (40 mg total) by mouth daily. - Oral  3. Are you having a reaction (difficulty breathing--STAT)? no  4. What is your medication issue? Pt calling stating that this medication made her nauseated and made her throw up liquid, she wants to know if there is something else she can take

## 2022-04-21 ENCOUNTER — Telehealth: Payer: Self-pay

## 2022-04-21 NOTE — Telephone Encounter (Signed)
Voicemail.  Letter via MyChart and mailed to pt encouraging call back to reschedule PV and colonoscopy.  PV today and colonoscopy 05/22/22 canceled per protocol.

## 2022-04-21 NOTE — Telephone Encounter (Signed)
Second voicemail left for pt to please call back to reschedule PV. If no call back, will need to cancel PV and colonoscopy.

## 2022-04-21 NOTE — Telephone Encounter (Signed)
No Show PV. Message leftf to please call back to reschedule PV. If no call back received today, will need to cancel colonoscopy scheduled for 05/22/22.

## 2022-05-02 ENCOUNTER — Telehealth: Payer: Self-pay | Admitting: *Deleted

## 2022-05-02 NOTE — Telephone Encounter (Signed)
Unable to get through using telephone number listed on DPR or number listed in demographics.  Message:  Call cannot be completed as dialed.

## 2022-05-22 ENCOUNTER — Encounter: Payer: Medicare Other | Admitting: Gastroenterology

## 2022-05-26 ENCOUNTER — Other Ambulatory Visit: Payer: Self-pay | Admitting: Rehabilitation

## 2022-05-26 DIAGNOSIS — M47816 Spondylosis without myelopathy or radiculopathy, lumbar region: Secondary | ICD-10-CM

## 2022-06-30 ENCOUNTER — Inpatient Hospital Stay: Admission: RE | Admit: 2022-06-30 | Payer: Medicare Other | Source: Ambulatory Visit

## 2022-07-07 ENCOUNTER — Other Ambulatory Visit: Payer: Self-pay

## 2022-07-07 NOTE — Telephone Encounter (Signed)
Dr. Radford Pax D/C pt's Nexium, per note on 07/07/22, sent by Claiborne Billings, Production assistant, radio. Medication D/C, medication no longer on pt's medication list.

## 2023-02-14 ENCOUNTER — Ambulatory Visit
Admission: EM | Admit: 2023-02-14 | Discharge: 2023-02-14 | Disposition: A | Discharge status: Home / Self Care | Payer: 59 | Attending: Physician Assistant | Admitting: Physician Assistant

## 2023-02-14 DIAGNOSIS — K529 Noninfective gastroenteritis and colitis, unspecified: Secondary | ICD-10-CM | POA: Diagnosis not present

## 2023-02-14 MED ORDER — ONDANSETRON 4 MG PO TBDP: 4.0000 mg | ORAL_TABLET | Freq: Three times a day (TID) | ORAL | 0 refills | Status: AC | PRN

## 2023-02-14 NOTE — ED Triage Notes (Signed)
"  I am sick as a dog". "Last night started with Nausea, then around 1230/1am with vomiting and ever since".  No fever. "Stomach ache" (not pain). Voids "fine". Stools "normal". Last emesis "1000am".

## 2023-02-15 ENCOUNTER — Encounter: Payer: Self-pay | Admitting: Physician Assistant
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
## 2023-09-04 ENCOUNTER — Ambulatory Visit: Payer: 59 | Admitting: Psychology

## 2023-09-04 DIAGNOSIS — F319 Bipolar disorder, unspecified: Secondary | ICD-10-CM
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
## 2023-09-13 ENCOUNTER — Ambulatory Visit: Admitting: Psychology

## 2023-09-14 ENCOUNTER — Ambulatory Visit: Admitting: Psychology

## 2023-09-14 DIAGNOSIS — F319 Bipolar disorder, unspecified: Secondary | ICD-10-CM

## 2023-09-14 NOTE — Progress Notes (Signed)
 [facility] Counselor/Therapist Progress Note  Patient ID: Sandra Jensen, MRN: [id number]   Date: 09/14/23  Time Spent: 5:00 pm - 5:48 pm : 48 minutes  Treatment Type: Individual Therapy.  Reported Symptoms: Feeling nervous and anxious, trouble relaxing, restless and hard to sit still, becoming easily annoyed and irritable, little pleasure in doing things, trouble with sleep, trouble concentrating.   Mental Status Exam: Appearance:  Casual     Behavior: Appropriate  Motor: Normal  Speech/Language:  Normal Rate  Affect: Appropriate  Mood: normal  Thought process: normal  Thought content:   WNL  Sensory/Perceptual disturbances:   WNL  Orientation: oriented to person, place, and time/date  Attention: Good  Concentration: Good  Memory: WNL  Fund of knowledge:  Good  Insight:   Good  Judgment:  Good  Impulse Control: Good   Risk Assessment: Danger to Self:  No Self-injurious Behavior: No Danger to Others: No Duty to Warn:no Physical Aggression / Violence:No  Access to Firearms a concern: No  Gang Involvement:No   Subjective:   Sandra Jensen participated from home, via video and consented to treatment. Therapist participated from home office. I discussed the limitations of evaluation and management by telemedicine and the availability of in person appointments. The patient expressed understanding and agreed to proceed. Netra reviewed the events of the past week.   Patient talked about her previous relationship with Waynetta Sandy, who was one year older, and died 09-17-21 (before current relationship with Claris Gower, who is 47 years younger). Patient reported that Shirlee Limerick from last session's note is actually [place] (patient's significant other who patient calls her sister because they were so close.)  We reviewed numerous treatment approaches including CBT, BA, Problem Solving, and Solution focused therapy. Psych-education regarding the Breeona's diagnosis of Bipolar  Disorder 31.9 was provided during the session.   We discussed Damiah Mcdonald Seals's goals treatment goals which include increasing her self confidence and learning  how to say what is on her mind/speak up for herself.   Sandra Jensen provided verbal approval of the treatment plan.   Interventions: Psycho-education & Goal Setting.   Diagnosis:  Bipolar Disorder 31.9  Psychiatric Treatment: Yes , via PCP  Treatment Plan:  Client Abilities/Strengths Shanaya is motivated for change, caring towards others, and committed to learning more about how her past behavior impacts her current behavior.   Support System: Older sister, Alvis Lemmings, and significant other, IT sales professional Preferences OPT  Client Statement of Needs Anette would like to set goals, increase her healthy social interactions, exercise regularly, symptom management, and engage in consistent self-care.     Treatment Level Weekly/Biweekly  Symptoms  Anxiety: Feeling nervous and anxious, trouble relaxing, restless and hard to sit still, becoming easily annoyed and irritable. (Status: maintained) Depression: little pleasure in doing things, trouble with sleep, trouble concentrating. (Status: maintained)   Goals:   Tonna experiences symptoms of Bipolar Disorder.  Treatment plan signed and available on s-drive:  No, pending signature.    Target Date: 09/03/24 Frequency: Weekly/Biweekly  Progress: 0 Modality: individual    Therapist will provide referrals for additional resources as appropriate.  Therapist will provide psycho-education regarding Sherl's diagnosis and corresponding treatment approaches and interventions. Helyn Numbers will support the patient's ability to achieve the goals identified. will employ CBT, BA, Problem-solving, Solution Focused, Mindfulness,  coping skills, & other evidenced-based practices will be used to promote progress towards healthy functioning to help manage decrease symptoms  associated with their diagnosis.  Reduce overall level, frequency, and intensity of the feelings of depression, anxiety and panic evidenced by decreased overall symptoms from 6 to 7 days/week to 0 to 1 days/week per client report for at least 3 consecutive months. Verbally express understanding of the relationship between feelings of depression and anxiety and their impact on thinking patterns and behaviors. Verbalize an understanding of the role that distorted thinking plays in creating fears, excessive worry, and ruminations.    Marchelle Folks participated in the creation of the treatment plan)    Helyn Numbers

## 2023-09-18 ENCOUNTER — Other Ambulatory Visit: Payer: Self-pay | Admitting: Nurse Practitioner

## 2023-09-18 DIAGNOSIS — Z1231 Encounter for screening mammogram for malignant neoplasm of breast: Secondary | ICD-10-CM

## 2023-09-27 ENCOUNTER — Ambulatory Visit: Admitting: Psychology

## 2023-09-27 DIAGNOSIS — F319 Bipolar disorder, unspecified: Secondary | ICD-10-CM | POA: Diagnosis not present

## 2023-09-27 NOTE — Progress Notes (Signed)
 [facility] Counselor/Therapist Progress Note  Patient ID: TIANA SIVERTSON, MRN: [id number]    Date: 09/27/23  Time Spent: 11:00 - 11:47: 47 minutes    Treatment Type: Individual Therapy.  Reported Symptoms:  Feeling nervous and anxious, trouble relaxing, restless and hard to sit still, becoming easily annoyed and irritable, little pleasure in doing things, trouble with sleep, trouble concentrating.     Mental Status Exam:  Appearance:  Well Groomed     Behavior: Appropriate                        Motor:  Normal  Speech/Language:  Normal Rate  Affect: Appropriate  Mood: normal  Thought process: normal  Thought content:   WNL  Sensory/Perceptual disturbances:   WNL  Orientation: oriented to person, place, and time/date  Attention: Good  Concentration: Good  Memory: WNL  Fund of knowledge:  Good  Insight:   Good  Judgment:  Good  Impulse Control: Good   Risk Assessment: Danger to Self:  No Self-injurious Behavior: No Danger to Others: No Duty to Warn:no Physical Aggression / Violence:No  Access to Firearms a concern: No  Gang Involvement:No   Subjective:   Estill Dooms participated from home, via video, and consented to treatment. I discussed the limitations of evaluation and management by telemedicine and the availability of in person appointments. The patient expressed understanding and agreed to proceed. Therapist participated from home office.  Kamali reviewed the events of the past week.   We revisited patients goals of:   Improving her self confidence. Patient will put a yellow sticky note on her mirror reminding herself every morning to read it "I have a beautiful smile" and throughout the day whenever she looks in the mirror.    2. Saying what is on her mind/sticking up for herself  When their relationship doesn't go well, Claris Gower will say "You broke a promise to Grandma" or you, You don't care about our relationship". "You broke a  promise to [employer] grandmother needs the money, patient doesn't remember promising to do that.  "You don't care about our relationship"--meaning if you did, you would send me money. Patient will remind Claris Gower that she didn't make a promise to her (Charlotte's) grandmother, nor did she make a promise to send her money.   Patient reported that she would like to start seeing a new psychiatrist who is located closer to where she lives now. Patient will contact her current psychiatrist to schedule an appointment with them, because she is almost out of her "psychiatric medication". Patient will also ask for a referral to a new psychiatrist closer to her current home.   Interventions: Cognitive Behavioral Therapy  Diagnosis:  Bipolar Disorder 31.9   Psychiatric Treatment: Yes , via PCP  Treatment Plan:  Client Abilities/Strengths Toriann is motivated for change, caring towards others, and committed to learning more about how her past behavior impacts her current behavior.   Support System: Anjenette is motivated for change, caring towards others, and committed to learning more about how her past behavior impacts her current behavior   Client Treatment Preferences Nasira is motivated for change, caring towards others, and committed to learning more about how her past behavior impacts her current behavior.  Client Statement of Needs Anneth would like to  set goals, increase her healthy social interactions, exercise regularly, symptom management, and engage in consistent self-care.    Treatment Level Weekly/Biweekly  Symptoms  Anxiety: Feeling nervous  and anxious, trouble relaxing, restless and hard to sit still, becoming easily annoyed and irritable. (Status: maintained) Depression: little pleasure in doing things, trouble with sleep, trouble concentrating. (Status: maintained)  Goals:   Jakirah experiences symptoms of Bipolar Disorder.   Target Date: 09/03/24  Frequency: Weekly/Biweekly  Progress: 0 Modality: individual    Therapist will provide referrals for additional resources as appropriate.  Therapist will provide psycho-education regarding Angella's diagnosis and corresponding treatment approaches and interventions. Helyn Numbers will support the patient's ability to achieve the goals identified. will employ CBT, BA, Problem-solving, Solution Focused, Mindfulness,  coping skills, & other evidenced-based practices will be used to promote progress towards healthy functioning to help manage decrease symptoms associated with their diagnosis.   Reduce overall level, frequency, and intensity of the feelings of depression, anxiety and panic evidenced by decreased overall symptoms from 6 to 7 days/week to 0 to 1 days/week per client report for at least 3 consecutive months. Verbally express understanding of the relationship between feelings of Bipolar disorder and its impact on thinking patterns and behaviors. Verbalize an understanding of the role that distorted thinking plays in creating fears, excessive worry, and ruminations.  Marchelle Folks participated in the creation of the treatment plan)    Helyn Numbers

## 2023-10-01 ENCOUNTER — Ambulatory Visit (INDEPENDENT_AMBULATORY_CARE_PROVIDER_SITE_OTHER): Admitting: Psychology

## 2023-10-01 DIAGNOSIS — F319 Bipolar disorder, unspecified: Secondary | ICD-10-CM

## 2023-10-01 NOTE — Progress Notes (Signed)
 [facility] Counselor/Therapist Progress Note  Patient ID: Sandra Jensen, MRN: [id number]    Date: 10/01/23  Time Spent: 12:00 p.m. - 12:46  p.m.  : 46 minutes  Treatment Type: Individual Therapy.  Reported Symptoms:  Feeling nervous and anxious, trouble relaxing, restless and hard to sit still, becoming easily annoyed and irritable, little pleasure in doing things, trouble with sleep, trouble concentrating.   Mental Status Exam: Appearance:  Casual     Behavior: Appropriate  Motor: Normal  Speech/Language:  Normal Rate  Affect: Appropriate  Mood: normal  Thought process: normal  Thought content:   WNL  Sensory/Perceptual disturbances:   WNL  Orientation: oriented to person, place, and time/date  Attention: Good  Concentration: Good  Memory: WNL  Fund of knowledge:  Good  Insight:   Good  Judgment:  Good  Impulse Control: Good   Risk Assessment: Danger to Self:  No Self-injurious Behavior: No Danger to Others: No Duty to Warn:no Physical Aggression / Violence:No  Access to Firearms a concern: No  Gang Involvement:No   Subjective:   Estill Dooms participated from home, via video, and consented to treatment. I discussed the limitations of evaluation and management by telemedicine and the availability of in person appointments. The patient expressed understanding and agreed to proceed.  Therapist participated from home office.  Margreat reviewed the events of the past week.   Patient forgot to put the yellow stickies on her mirror reminding her that she has a lovely smile, and that she had beautiful hair. Patient planned to try remembering to do it this coming week. We discussed patient continuing to practice self-care and we discussed her following up with her current psychiatrist, and why that needs to be a priority.   Interventions: Cognitive Behavioral Therapy  Diagnosis:  Bipolar Disorder 31.9   Psychiatric Treatment: Yes , via  psychiatrist  Treatment Plan:  Client Abilities/Strengths Aina is motivated for change, caring towards others, and committed to learning more about how her past behavior impacts her current behavior.   Support System: Ahria is motivated for change, caring towards others, and committed to learning more about how her past behavior impacts her current behavior.  Client Treatment Preferences OPT  Client Statement of Needs Haylin would like to set goals, increase her healthy social interactions, exercise regularly, symptom management, and engage in consistent self-care.       Treatment Level Weekly/Biweekly  Symptoms  Anxiety: Feeling nervous and anxious, trouble relaxing, restless and hard to sit still, becoming easily annoyed and irritable. (Status: maintained) Depression: little pleasure in doing things, trouble with sleep, trouble concentrating. (Status: maintained)  Goals:   Victoria experiences symptoms of Bipolar Disorder.   Target Date: 09/03/24 Frequency: Weekly/Biweekly  Progress: 0 Modality: individual    Therapist will provide referrals for additional resources as appropriate.  Therapist will provide psycho-education regarding Peggyann's diagnosis and corresponding treatment approaches and interventions. Helyn Numbers will support the patient's ability to achieve the goals identified. will employ CBT, BA, Problem-solving, Solution Focused, Mindfulness,  coping skills, & other evidenced-based practices will be used to promote progress towards healthy functioning to help manage decrease symptoms associated with their diagnosis.   Reduce overall level, frequency, and intensity of the feelings of depression, anxiety and panic evidenced by decreased overall symptoms from 6 to 7 days/week to 0 to 1 days/week per client report for at least 3 consecutive months. Verbally express understanding of the relationship between feelings of Bipolar Disorder and its impact on thinking  patterns and behaviors. Verbalize an understanding of the role that distorted thinking plays in creating fears, excessive worry, and ruminations.  Marchelle Folks participated in the creation of the treatment plan)    Helyn Numbers

## 2023-10-10 ENCOUNTER — Encounter: Admitting: Psychology

## 2023-10-10 NOTE — Progress Notes (Signed)
 This encounter was created in error - please disregard.

## 2023-10-17 ENCOUNTER — Ambulatory Visit: Admitting: Psychology

## 2023-10-17 DIAGNOSIS — F319 Bipolar disorder, unspecified: Secondary | ICD-10-CM | POA: Diagnosis not present

## 2023-10-17 NOTE — Progress Notes (Signed)
 [facility] Counselor/Therapist Progress Note  Patient ID: Sandra Jensen, MRN: [id number]    Date: 10/17/23  Time Spent: 12:00 pm - 12:44  pm  :  44 minutes     Treatment Type: Individual Therapy.  Reported Symptoms:  Feeling nervous and anxious, trouble relaxing, restless and hard to sit still, becoming easily annoyed and irritable, little pleasure in doing things, trouble with sleep, trouble concentrating.    Mental Status Exam:  Appearance:  Well Groomed     Behavior: Appropriate  Motor: Normal  Speech/Language:  Normal Rate  Affect: Appropriate  Mood: normal  Thought process: normal  Thought content:   WNL  Sensory/Perceptual disturbances:   WNL  Orientation: oriented to person, place, and time/date  Attention: Good  Concentration: Good  Memory: WNL  Fund of knowledge:  Good  Insight:   Good  Judgment:  Good  Impulse Control: Good   Risk Assessment: Danger to Self: No Self-injurious Behavior: No Danger to Others: No Duty to Warn:no Physical Aggression / Violence:No  Access to Firearms a concern: No  Gang Involvement:No   Subjective:   Sandra Jensen participated from home, via video, and consented to treatment. I discussed the limitations of evaluation and management by telemedicine and the availability of in person appointments. The patient expressed understanding and agreed to proceed.  Therapist participated from home office.  Sandra Jensen reviewed the events of the past week.   Sandra Jensen died before patient could give her the t shirt that she was wearing during our session today. Not scheduled to see current psychiatrist until May 15, believes she is on the cancellation list for an appointment. Patient has enough medication from her psychiatrist to last her until May 15. Patient has not followed through with putting the yellow sticky note on her mirror. Patient will color in her adult coloring book today, from one page of the coloring book, to two pages of  the coloring book. Patient will do that 2-3 days a week, between now and our next session.   Interventions: Cognitive Behavioral Therapy  Diagnosis: Bipolar Disorder 31.9   Psychiatric Treatment: Yes , via PC  Treatment Plan:  Client Abilities/Strengths Sandra Jensen is motivated for change, caring towards others, and committed to learning more about how her past behavior impacts her current behavior.   Support System: Sandra Jensen is motivated for change, caring towards others, and committed to learning more about how her past behavior impacts her current behavior.   Client Treatment Preferences OPT  Client Statement of Needs Sandra Jensen would like to set goals, increase her healthy social interactions, exercise regularly, symptom management, and engage in consistent self-care.    Treatment Level Weekly  Symptoms  Anxiety: Feeling nervous and anxious, trouble relaxing, restless and hard to sit still, becoming easily annoyed and irritable. (Status: maintained) Depression: little pleasure in doing things, trouble with sleep, trouble concentrating. (Status: maintained)   Goals:   Sandra Jensen experiences symptoms of Bipolar Disorder.   Target Date: 09/03/24 Frequency: Weekly/Biweekly  Progress: 0 Modality: individual    Therapist will provide referrals for additional resources as appropriate.  Therapist will provide psycho-education regarding Sandra Jensen's diagnosis and corresponding treatment approaches and interventions. Sandra Jensen will support the patient's ability to achieve the goals identified. will employ CBT, BA, Problem-solving, Solution Focused, Mindfulness,  coping skills, & other evidenced-based practices will be used to promote progress towards healthy functioning to help manage decrease symptoms associated with their diagnosis.   Reduce overall level, frequency, and intensity of the feelings  of depression, anxiety and panic evidenced by decreased overall symptom from 6 to 7 days/week to 0  to 1 days/week per client report for at least 3 consecutive months. Verbally express understanding of the relationship between feelings of depression, and anxiety and their impact on thinking patterns and behaviors. Verbalize an understanding of the role that distorted thinking plays in creating fears, excessive worry, and ruminations.  Sandra Jensen participated in the creation of the treatment plan)    Sandra Jensen

## 2023-10-24 ENCOUNTER — Encounter: Admitting: Psychology

## 2023-10-24 NOTE — Progress Notes (Signed)
                Sandra Jensen This encounter was created in error - please disregard.

## 2023-10-31 ENCOUNTER — Encounter: Admitting: Psychology

## 2023-10-31 NOTE — Progress Notes (Signed)
 This encounter was created in error - please disregard.

## 2023-11-07 ENCOUNTER — Ambulatory Visit: Admitting: Psychology

## 2023-11-14 ENCOUNTER — Ambulatory Visit: Admitting: Psychology

## 2023-11-21 ENCOUNTER — Ambulatory Visit: Admitting: Psychology

## 2023-11-28 ENCOUNTER — Ambulatory Visit: Admitting: Psychology

## 2023-12-05 ENCOUNTER — Ambulatory Visit: Admitting: Psychology

## 2023-12-12 ENCOUNTER — Ambulatory Visit: Admitting: Psychology

## 2023-12-19 ENCOUNTER — Ambulatory Visit: Admitting: Psychology

## 2024-06-30 ENCOUNTER — Ambulatory Visit: Payer: Self-pay

## 2024-06-30 NOTE — Telephone Encounter (Signed)
" °  Gave location of local UC.   Copied from CRM [id number]. Topic: Clinical - Red Word Triage >> Jun 30, 2024  4:16 PM Aisha D wrote: Red Word that prompted transfer to Nurse Triage: elevated heart rate, trouble breathing   Pt stated that she is experiencing an elevated heart rate (130) and has some trouble breathing. Pt would like to get an appt scheduled with a provider. Reason for Disposition  [1] MILD difficulty breathing (e.g., minimal/no SOB at rest, SOB with walking, pulse < 100) AND [2] NEW-onset or WORSE than normal  Answer Assessment - Initial Assessment Questions 1. RESPIRATORY STATUS: Describe your breathing? (e.g., wheezing, shortness of breath, unable to speak, severe coughing)      SOB mild at rest, increases with activity, fast HR earlier. Speaking in full sentences. Unable to see pcp (not [facility] pt).  Protocols used: Breathing Difficulty-A-AH  "
